# Patient Record
Sex: Male | Born: 1945 | Race: White | Hispanic: No | Marital: Married | Smoking: Never smoker
Health system: Southern US, Community
[De-identification: ages and names within clinical notes are randomized; demographics above are authoritative.]

## PROBLEM LIST (undated history)

## (undated) DIAGNOSIS — C801 Malignant (primary) neoplasm, unspecified: Secondary | ICD-10-CM

## (undated) DIAGNOSIS — I10 Essential (primary) hypertension: Secondary | ICD-10-CM

## (undated) DIAGNOSIS — E039 Hypothyroidism, unspecified: Secondary | ICD-10-CM

## (undated) DIAGNOSIS — I447 Left bundle-branch block, unspecified: Secondary | ICD-10-CM

## (undated) DIAGNOSIS — I2510 Atherosclerotic heart disease of native coronary artery without angina pectoris: Secondary | ICD-10-CM

## (undated) DIAGNOSIS — M1990 Unspecified osteoarthritis, unspecified site: Secondary | ICD-10-CM

## (undated) DIAGNOSIS — M5136 Other intervertebral disc degeneration, lumbar region: Secondary | ICD-10-CM

## (undated) DIAGNOSIS — N529 Male erectile dysfunction, unspecified: Secondary | ICD-10-CM

## (undated) DIAGNOSIS — E785 Hyperlipidemia, unspecified: Secondary | ICD-10-CM

## (undated) DIAGNOSIS — M51369 Other intervertebral disc degeneration, lumbar region without mention of lumbar back pain or lower extremity pain: Secondary | ICD-10-CM

## (undated) DIAGNOSIS — R55 Syncope and collapse: Secondary | ICD-10-CM

## (undated) HISTORY — DX: Male erectile dysfunction, unspecified: N52.9

## (undated) HISTORY — DX: Essential (primary) hypertension: I10

## (undated) HISTORY — DX: Syncope and collapse: R55

## (undated) HISTORY — DX: Hypothyroidism, unspecified: E03.9

## (undated) HISTORY — PX: CARDIAC CATHETERIZATION: SHX172

## (undated) HISTORY — DX: Left bundle-branch block, unspecified: I44.7

## (undated) HISTORY — DX: Atherosclerotic heart disease of native coronary artery without angina pectoris: I25.10

## (undated) HISTORY — PX: EYE SURGERY: SHX253

## (undated) HISTORY — PX: INCISIONAL HERNIA REPAIR: SHX193

## (undated) HISTORY — DX: Hyperlipidemia, unspecified: E78.5

## (undated) HISTORY — PX: TONSILLECTOMY: SUR1361

---

## 1998-11-22 HISTORY — PX: HERNIA REPAIR: SHX51

## 2003-11-23 DIAGNOSIS — C801 Malignant (primary) neoplasm, unspecified: Secondary | ICD-10-CM

## 2003-11-23 HISTORY — PX: OTHER SURGICAL HISTORY: SHX169

## 2003-11-23 HISTORY — DX: Malignant (primary) neoplasm, unspecified: C80.1

## 2004-03-13 ENCOUNTER — Other Ambulatory Visit: Admission: RE | Admit: 2004-03-13 | Discharge: 2004-03-13 | Payer: Self-pay | Admitting: Otolaryngology

## 2004-03-24 ENCOUNTER — Encounter: Admission: RE | Admit: 2004-03-24 | Discharge: 2004-03-24 | Payer: Self-pay | Admitting: Otolaryngology

## 2004-04-01 ENCOUNTER — Inpatient Hospital Stay (HOSPITAL_COMMUNITY): Admission: RE | Admit: 2004-04-01 | Discharge: 2004-04-02 | Payer: Self-pay | Admitting: Otolaryngology

## 2004-04-01 ENCOUNTER — Encounter (INDEPENDENT_AMBULATORY_CARE_PROVIDER_SITE_OTHER): Payer: Self-pay | Admitting: *Deleted

## 2004-04-08 ENCOUNTER — Ambulatory Visit: Admission: RE | Admit: 2004-04-08 | Discharge: 2004-07-07 | Payer: Self-pay | Admitting: Radiation Oncology

## 2004-04-14 ENCOUNTER — Encounter: Admission: AD | Admit: 2004-04-14 | Discharge: 2004-04-14 | Payer: Self-pay | Admitting: Dentistry

## 2004-05-17 ENCOUNTER — Inpatient Hospital Stay (HOSPITAL_COMMUNITY): Admission: AD | Admit: 2004-05-17 | Discharge: 2004-05-21 | Payer: Self-pay | Admitting: Oncology

## 2004-06-03 ENCOUNTER — Inpatient Hospital Stay (HOSPITAL_COMMUNITY): Admission: AD | Admit: 2004-06-03 | Discharge: 2004-06-09 | Payer: Self-pay | Admitting: Hematology & Oncology

## 2004-07-22 ENCOUNTER — Ambulatory Visit: Admission: RE | Admit: 2004-07-22 | Discharge: 2004-07-22 | Payer: Self-pay | Admitting: Radiation Oncology

## 2004-07-28 ENCOUNTER — Ambulatory Visit: Payer: Self-pay | Admitting: Dentistry

## 2004-08-06 ENCOUNTER — Ambulatory Visit (HOSPITAL_COMMUNITY): Admission: RE | Admit: 2004-08-06 | Discharge: 2004-08-06 | Payer: Self-pay | Admitting: Radiation Oncology

## 2004-09-17 ENCOUNTER — Ambulatory Visit: Admission: RE | Admit: 2004-09-17 | Discharge: 2004-09-17 | Payer: Self-pay | Admitting: Radiation Oncology

## 2004-10-29 ENCOUNTER — Ambulatory Visit (HOSPITAL_COMMUNITY): Admission: RE | Admit: 2004-10-29 | Discharge: 2004-10-29 | Payer: Self-pay | Admitting: Hematology & Oncology

## 2004-11-05 ENCOUNTER — Ambulatory Visit: Payer: Self-pay | Admitting: Hematology & Oncology

## 2004-12-17 ENCOUNTER — Ambulatory Visit: Admission: RE | Admit: 2004-12-17 | Discharge: 2004-12-17 | Payer: Self-pay | Admitting: Radiation Oncology

## 2005-01-20 ENCOUNTER — Ambulatory Visit (HOSPITAL_COMMUNITY): Admission: RE | Admit: 2005-01-20 | Discharge: 2005-01-20 | Payer: Self-pay | Admitting: Hematology & Oncology

## 2005-01-28 ENCOUNTER — Ambulatory Visit: Payer: Self-pay | Admitting: Hematology & Oncology

## 2005-03-17 ENCOUNTER — Ambulatory Visit: Admission: RE | Admit: 2005-03-17 | Discharge: 2005-03-17 | Payer: Self-pay | Admitting: Radiation Oncology

## 2005-04-22 ENCOUNTER — Ambulatory Visit (HOSPITAL_COMMUNITY): Admission: RE | Admit: 2005-04-22 | Discharge: 2005-04-22 | Payer: Self-pay | Admitting: Hematology & Oncology

## 2005-05-05 ENCOUNTER — Ambulatory Visit: Payer: Self-pay | Admitting: Hematology & Oncology

## 2005-06-16 ENCOUNTER — Ambulatory Visit: Admission: RE | Admit: 2005-06-16 | Discharge: 2005-06-16 | Payer: Self-pay | Admitting: Radiation Oncology

## 2005-06-23 ENCOUNTER — Encounter (INDEPENDENT_AMBULATORY_CARE_PROVIDER_SITE_OTHER): Payer: Self-pay | Admitting: *Deleted

## 2005-06-23 ENCOUNTER — Ambulatory Visit (HOSPITAL_COMMUNITY): Admission: RE | Admit: 2005-06-23 | Discharge: 2005-06-23 | Payer: Self-pay | Admitting: Orthopedic Surgery

## 2005-06-23 ENCOUNTER — Ambulatory Visit (HOSPITAL_BASED_OUTPATIENT_CLINIC_OR_DEPARTMENT_OTHER): Admission: RE | Admit: 2005-06-23 | Discharge: 2005-06-23 | Payer: Self-pay | Admitting: Orthopedic Surgery

## 2005-09-09 ENCOUNTER — Ambulatory Visit: Payer: Self-pay | Admitting: Hematology & Oncology

## 2005-09-13 ENCOUNTER — Ambulatory Visit (HOSPITAL_COMMUNITY): Admission: RE | Admit: 2005-09-13 | Discharge: 2005-09-13 | Payer: Self-pay | Admitting: Hematology & Oncology

## 2005-11-11 ENCOUNTER — Ambulatory Visit: Payer: Self-pay | Admitting: Gastroenterology

## 2005-11-29 ENCOUNTER — Ambulatory Visit: Payer: Self-pay | Admitting: Gastroenterology

## 2006-01-10 ENCOUNTER — Ambulatory Visit: Payer: Self-pay | Admitting: Hematology & Oncology

## 2006-01-12 ENCOUNTER — Ambulatory Visit (HOSPITAL_COMMUNITY): Admission: RE | Admit: 2006-01-12 | Discharge: 2006-01-12 | Payer: Self-pay | Admitting: Hematology & Oncology

## 2006-05-30 ENCOUNTER — Ambulatory Visit (HOSPITAL_COMMUNITY): Admission: RE | Admit: 2006-05-30 | Discharge: 2006-05-30 | Payer: Self-pay | Admitting: Hematology & Oncology

## 2006-05-30 ENCOUNTER — Ambulatory Visit: Payer: Self-pay | Admitting: Hematology & Oncology

## 2006-06-03 LAB — COMPREHENSIVE METABOLIC PANEL
BUN: 19 mg/dL (ref 6–23)
CO2: 21 mEq/L (ref 19–32)
Calcium: 9.1 mg/dL (ref 8.4–10.5)
Chloride: 104 mEq/L (ref 96–112)
Creatinine, Ser: 1.06 mg/dL (ref 0.40–1.50)
Total Bilirubin: 0.4 mg/dL (ref 0.3–1.2)

## 2006-06-03 LAB — CBC WITH DIFFERENTIAL/PLATELET
Basophils Absolute: 0 10*3/uL (ref 0.0–0.1)
HCT: 40.6 % (ref 38.7–49.9)
HGB: 13.9 g/dL (ref 13.0–17.1)
LYMPH%: 12.2 % — ABNORMAL LOW (ref 14.0–48.0)
MCH: 31.1 pg (ref 28.0–33.4)
MONO#: 0.6 10*3/uL (ref 0.1–0.9)
NEUT%: 69.1 % (ref 40.0–75.0)
Platelets: 219 10*3/uL (ref 145–400)
WBC: 5.3 10*3/uL (ref 4.0–10.0)
lymph#: 0.6 10*3/uL — ABNORMAL LOW (ref 0.9–3.3)

## 2006-12-02 ENCOUNTER — Ambulatory Visit (HOSPITAL_COMMUNITY): Admission: RE | Admit: 2006-12-02 | Discharge: 2006-12-02 | Payer: Self-pay | Admitting: Hematology & Oncology

## 2006-12-13 ENCOUNTER — Ambulatory Visit: Payer: Self-pay | Admitting: Hematology & Oncology

## 2006-12-16 LAB — COMPREHENSIVE METABOLIC PANEL
ALT: 11 U/L (ref 0–53)
AST: 12 U/L (ref 0–37)
Albumin: 4.3 g/dL (ref 3.5–5.2)
Calcium: 9.4 mg/dL (ref 8.4–10.5)
Chloride: 105 mEq/L (ref 96–112)
Creatinine, Ser: 1.01 mg/dL (ref 0.40–1.50)
Potassium: 4.2 mEq/L (ref 3.5–5.3)

## 2006-12-16 LAB — CBC WITH DIFFERENTIAL/PLATELET
BASO%: 0.9 % (ref 0.0–2.0)
EOS%: 6.8 % (ref 0.0–7.0)
MCH: 31 pg (ref 28.0–33.4)
MCHC: 34.1 g/dL (ref 32.0–35.9)
RDW: 13.1 % (ref 11.2–14.6)
lymph#: 0.9 10*3/uL (ref 0.9–3.3)

## 2007-07-13 ENCOUNTER — Ambulatory Visit (HOSPITAL_COMMUNITY): Admission: RE | Admit: 2007-07-13 | Discharge: 2007-07-13 | Payer: Self-pay | Admitting: Hematology & Oncology

## 2007-07-21 ENCOUNTER — Ambulatory Visit: Payer: Self-pay | Admitting: Hematology & Oncology

## 2007-09-21 ENCOUNTER — Ambulatory Visit (HOSPITAL_COMMUNITY): Admission: RE | Admit: 2007-09-21 | Discharge: 2007-09-21 | Payer: Self-pay | Admitting: Surgery

## 2007-11-23 HISTORY — PX: FRACTURE SURGERY: SHX138

## 2008-06-18 ENCOUNTER — Ambulatory Visit: Payer: Self-pay | Admitting: Hematology & Oncology

## 2008-08-08 ENCOUNTER — Ambulatory Visit: Payer: Self-pay | Admitting: Hematology & Oncology

## 2008-08-08 ENCOUNTER — Ambulatory Visit (HOSPITAL_COMMUNITY): Admission: RE | Admit: 2008-08-08 | Discharge: 2008-08-08 | Payer: Self-pay | Admitting: Hematology & Oncology

## 2008-08-09 LAB — CBC WITH DIFFERENTIAL (CANCER CENTER ONLY)
BASO#: 0 10*3/uL (ref 0.0–0.2)
EOS%: 3.4 % (ref 0.0–7.0)
HGB: 14.6 g/dL (ref 13.0–17.1)
LYMPH%: 24.2 % (ref 14.0–48.0)
MCH: 30.2 pg (ref 28.0–33.4)
MCHC: 34 g/dL (ref 32.0–35.9)
MCV: 89 fL (ref 82–98)
MONO%: 11.1 % (ref 0.0–13.0)
NEUT#: 2.9 10*3/uL (ref 1.5–6.5)

## 2008-08-09 LAB — TSH: TSH: 8.367 u[IU]/mL — ABNORMAL HIGH (ref 0.350–4.500)

## 2008-08-09 LAB — COMPREHENSIVE METABOLIC PANEL
ALT: 15 U/L (ref 0–53)
Alkaline Phosphatase: 52 U/L (ref 39–117)
CO2: 22 mEq/L (ref 19–32)
Creatinine, Ser: 1.11 mg/dL (ref 0.40–1.50)
Sodium: 139 mEq/L (ref 135–145)
Total Bilirubin: 0.5 mg/dL (ref 0.3–1.2)
Total Protein: 6.8 g/dL (ref 6.0–8.3)

## 2008-08-09 LAB — TECHNOLOGIST REVIEW CHCC SATELLITE

## 2009-06-04 ENCOUNTER — Inpatient Hospital Stay (HOSPITAL_COMMUNITY): Admission: EM | Admit: 2009-06-04 | Discharge: 2009-06-12 | Payer: Self-pay | Admitting: Emergency Medicine

## 2009-06-05 ENCOUNTER — Encounter: Payer: Self-pay | Admitting: Cardiothoracic Surgery

## 2009-06-05 ENCOUNTER — Ambulatory Visit: Payer: Self-pay | Admitting: Cardiothoracic Surgery

## 2009-06-06 HISTORY — PX: CORONARY ARTERY BYPASS GRAFT: SHX141

## 2009-06-09 ENCOUNTER — Encounter: Payer: Self-pay | Admitting: Cardiothoracic Surgery

## 2009-06-13 ENCOUNTER — Inpatient Hospital Stay (HOSPITAL_COMMUNITY): Admission: EM | Admit: 2009-06-13 | Discharge: 2009-06-15 | Payer: Self-pay | Admitting: Emergency Medicine

## 2009-06-13 ENCOUNTER — Ambulatory Visit: Payer: Self-pay | Admitting: Internal Medicine

## 2009-06-15 ENCOUNTER — Inpatient Hospital Stay (HOSPITAL_COMMUNITY): Admission: EM | Admit: 2009-06-15 | Discharge: 2009-06-18 | Payer: Self-pay | Admitting: Emergency Medicine

## 2009-06-15 ENCOUNTER — Ambulatory Visit: Payer: Self-pay | Admitting: Internal Medicine

## 2009-06-16 ENCOUNTER — Encounter: Payer: Self-pay | Admitting: Cardiology

## 2009-06-25 ENCOUNTER — Encounter: Payer: Self-pay | Admitting: Internal Medicine

## 2009-06-26 ENCOUNTER — Ambulatory Visit: Payer: Self-pay | Admitting: Cardiothoracic Surgery

## 2009-06-26 ENCOUNTER — Encounter: Admission: RE | Admit: 2009-06-26 | Discharge: 2009-06-26 | Payer: Self-pay | Admitting: Cardiothoracic Surgery

## 2009-07-24 ENCOUNTER — Encounter (HOSPITAL_COMMUNITY): Admission: RE | Admit: 2009-07-24 | Discharge: 2009-10-22 | Payer: Self-pay | Admitting: Cardiology

## 2009-08-15 ENCOUNTER — Ambulatory Visit: Payer: Self-pay | Admitting: Cardiothoracic Surgery

## 2009-09-03 ENCOUNTER — Ambulatory Visit: Payer: Self-pay | Admitting: Hematology & Oncology

## 2009-10-23 ENCOUNTER — Encounter (HOSPITAL_COMMUNITY): Admission: RE | Admit: 2009-10-23 | Discharge: 2009-11-18 | Payer: Self-pay | Admitting: Cardiology

## 2010-07-07 ENCOUNTER — Telehealth (INDEPENDENT_AMBULATORY_CARE_PROVIDER_SITE_OTHER): Payer: Self-pay | Admitting: *Deleted

## 2010-07-08 ENCOUNTER — Encounter: Payer: Self-pay | Admitting: Cardiovascular Disease

## 2010-07-08 ENCOUNTER — Ambulatory Visit: Payer: Self-pay

## 2010-07-08 ENCOUNTER — Ambulatory Visit: Payer: Self-pay | Admitting: Cardiovascular Disease

## 2010-07-08 ENCOUNTER — Encounter (HOSPITAL_COMMUNITY): Admission: RE | Admit: 2010-07-08 | Discharge: 2010-08-13 | Payer: Self-pay | Admitting: Cardiology

## 2010-08-27 IMAGING — CR DG CHEST 1V PORT
1 series · 1 of 1 positions shown · non-contrast
Comparison: 06/06/2009 and earlier.

CLINICAL DATA: 62-year-old male status post CABG.

PORTABLE CHEST - 1 VIEW

[view not recorded]
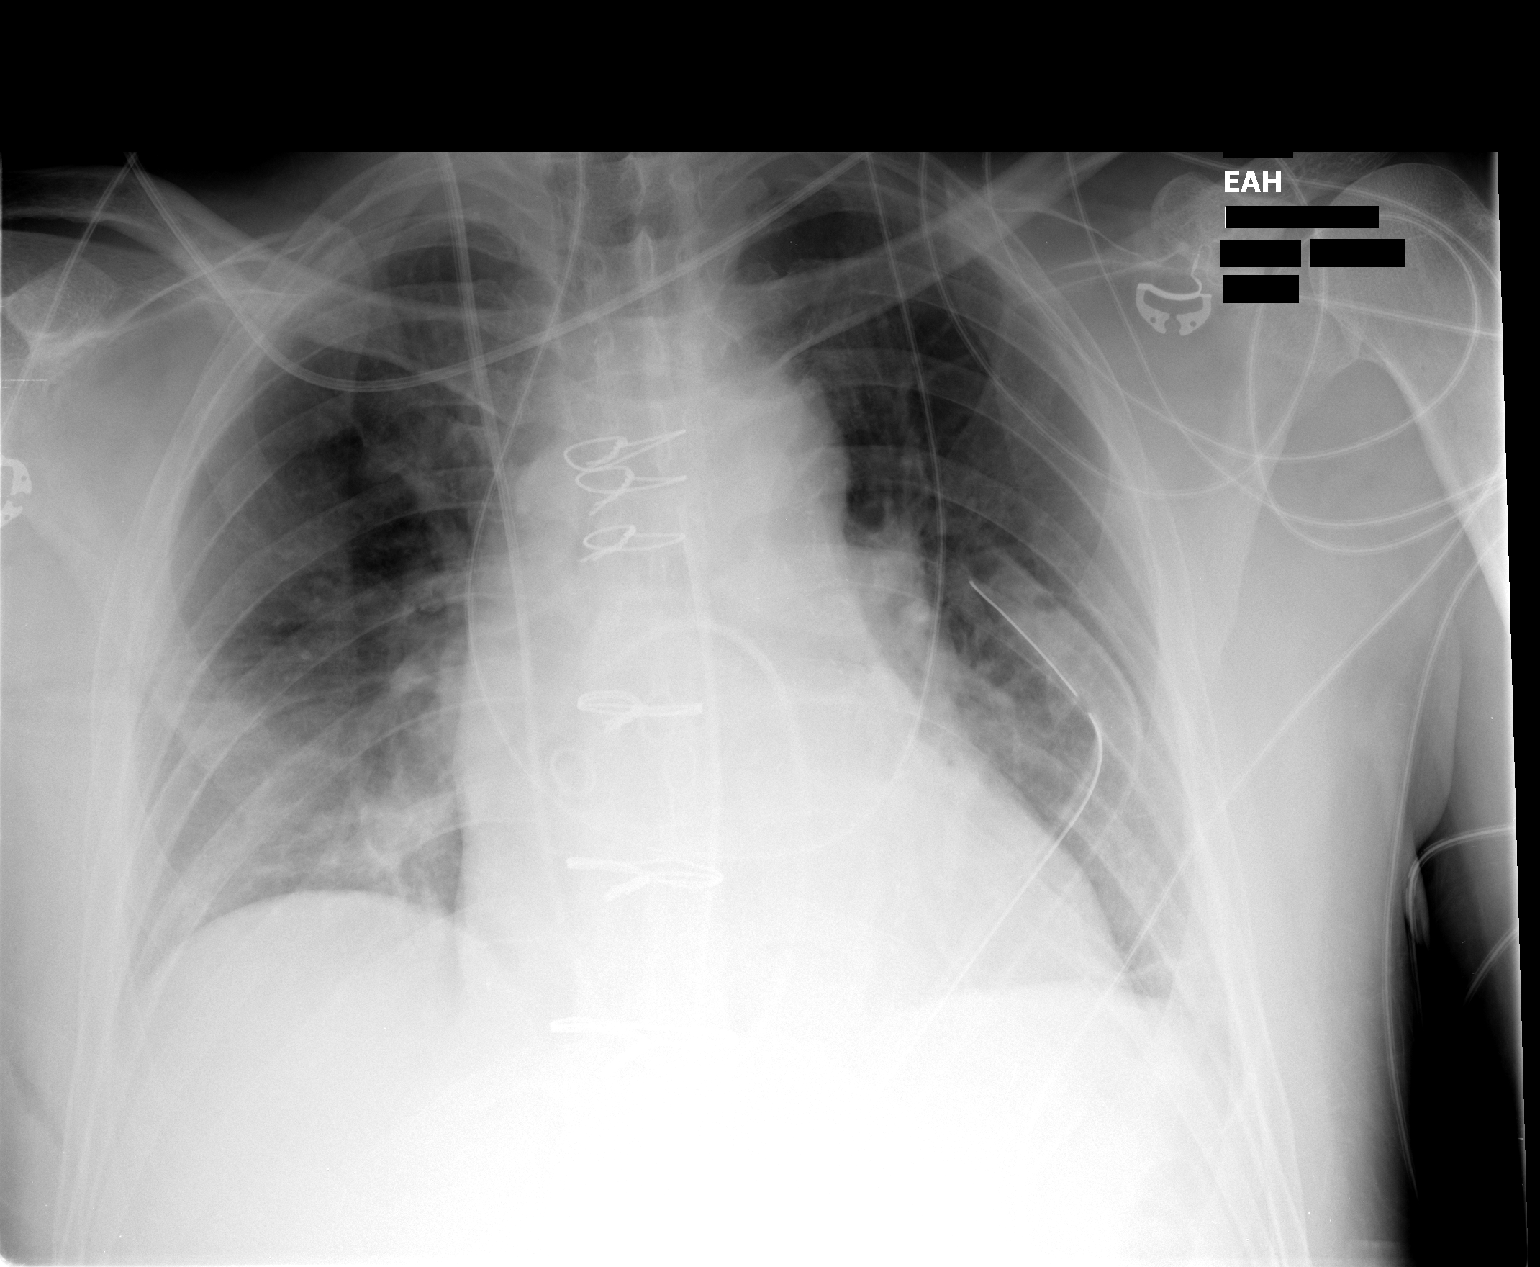

[1 of 1 positions shown; findings below may reference images not displayed]

FINDINGS: Portable semi upright AP view 2622 hours.  Stable right
IJ approach Swan-Ganz catheter, tip the level of the right main
pulmonary artery.  The patient is extubated.  Enteric tube is
removed.  Stable left thoracostomy tube and mediastinal drain.
Stable sequelae of CABG.  Stable cardiac size and mediastinal
contours.  Interval decreased atelectasis.  No pulmonary edema or
pneumothorax identified.
IMPRESSION: 1.  Extubated.  Enteric tube removed.
2.  Decreasing atelectasis.  No pneumothorax identified.

## 2010-08-29 IMAGING — CR DG CHEST 2V
2 series · 2 of 2 positions shown · non-contrast
Comparison: 06/08/2009

CLINICAL DATA: Unstable angina/post CABG

CHEST - 2 VIEW

[w chest pa]
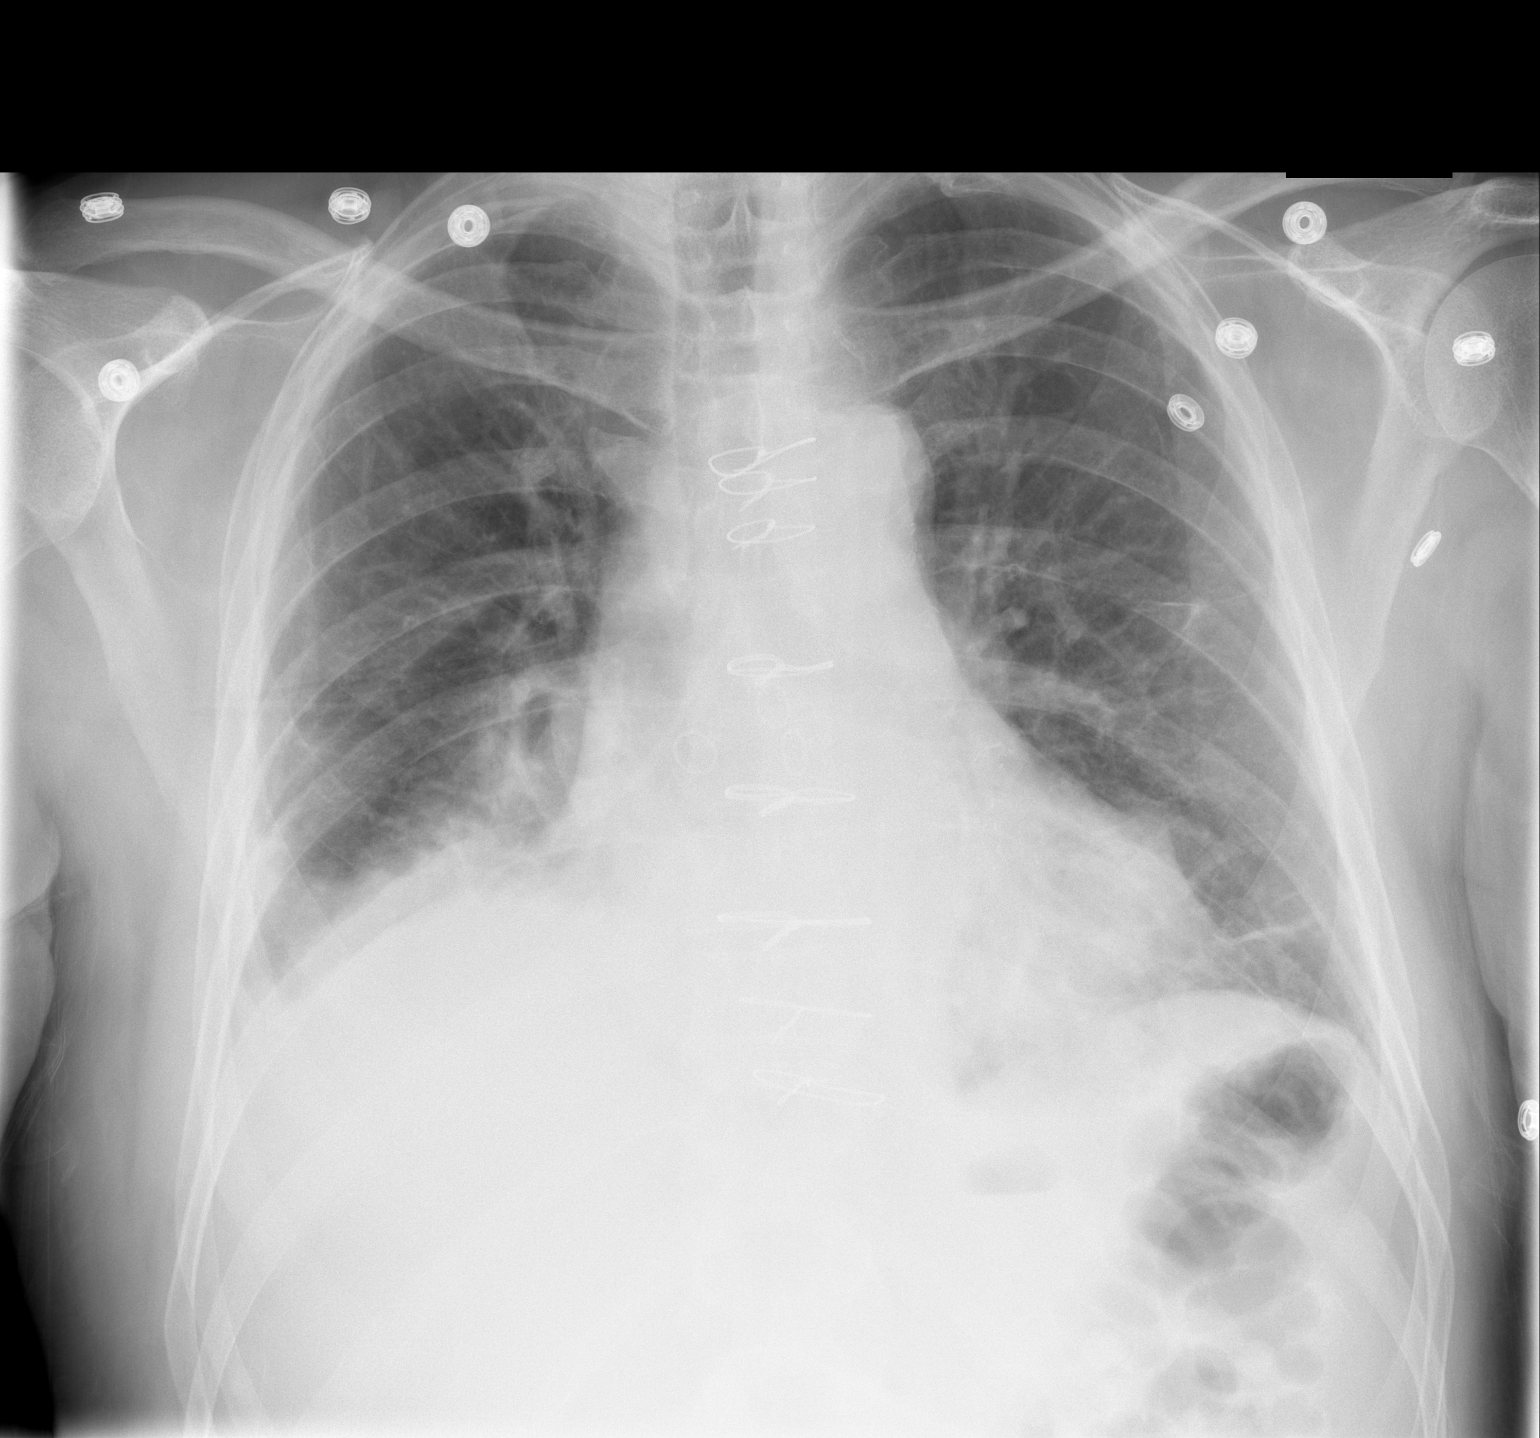

[w chest lat]
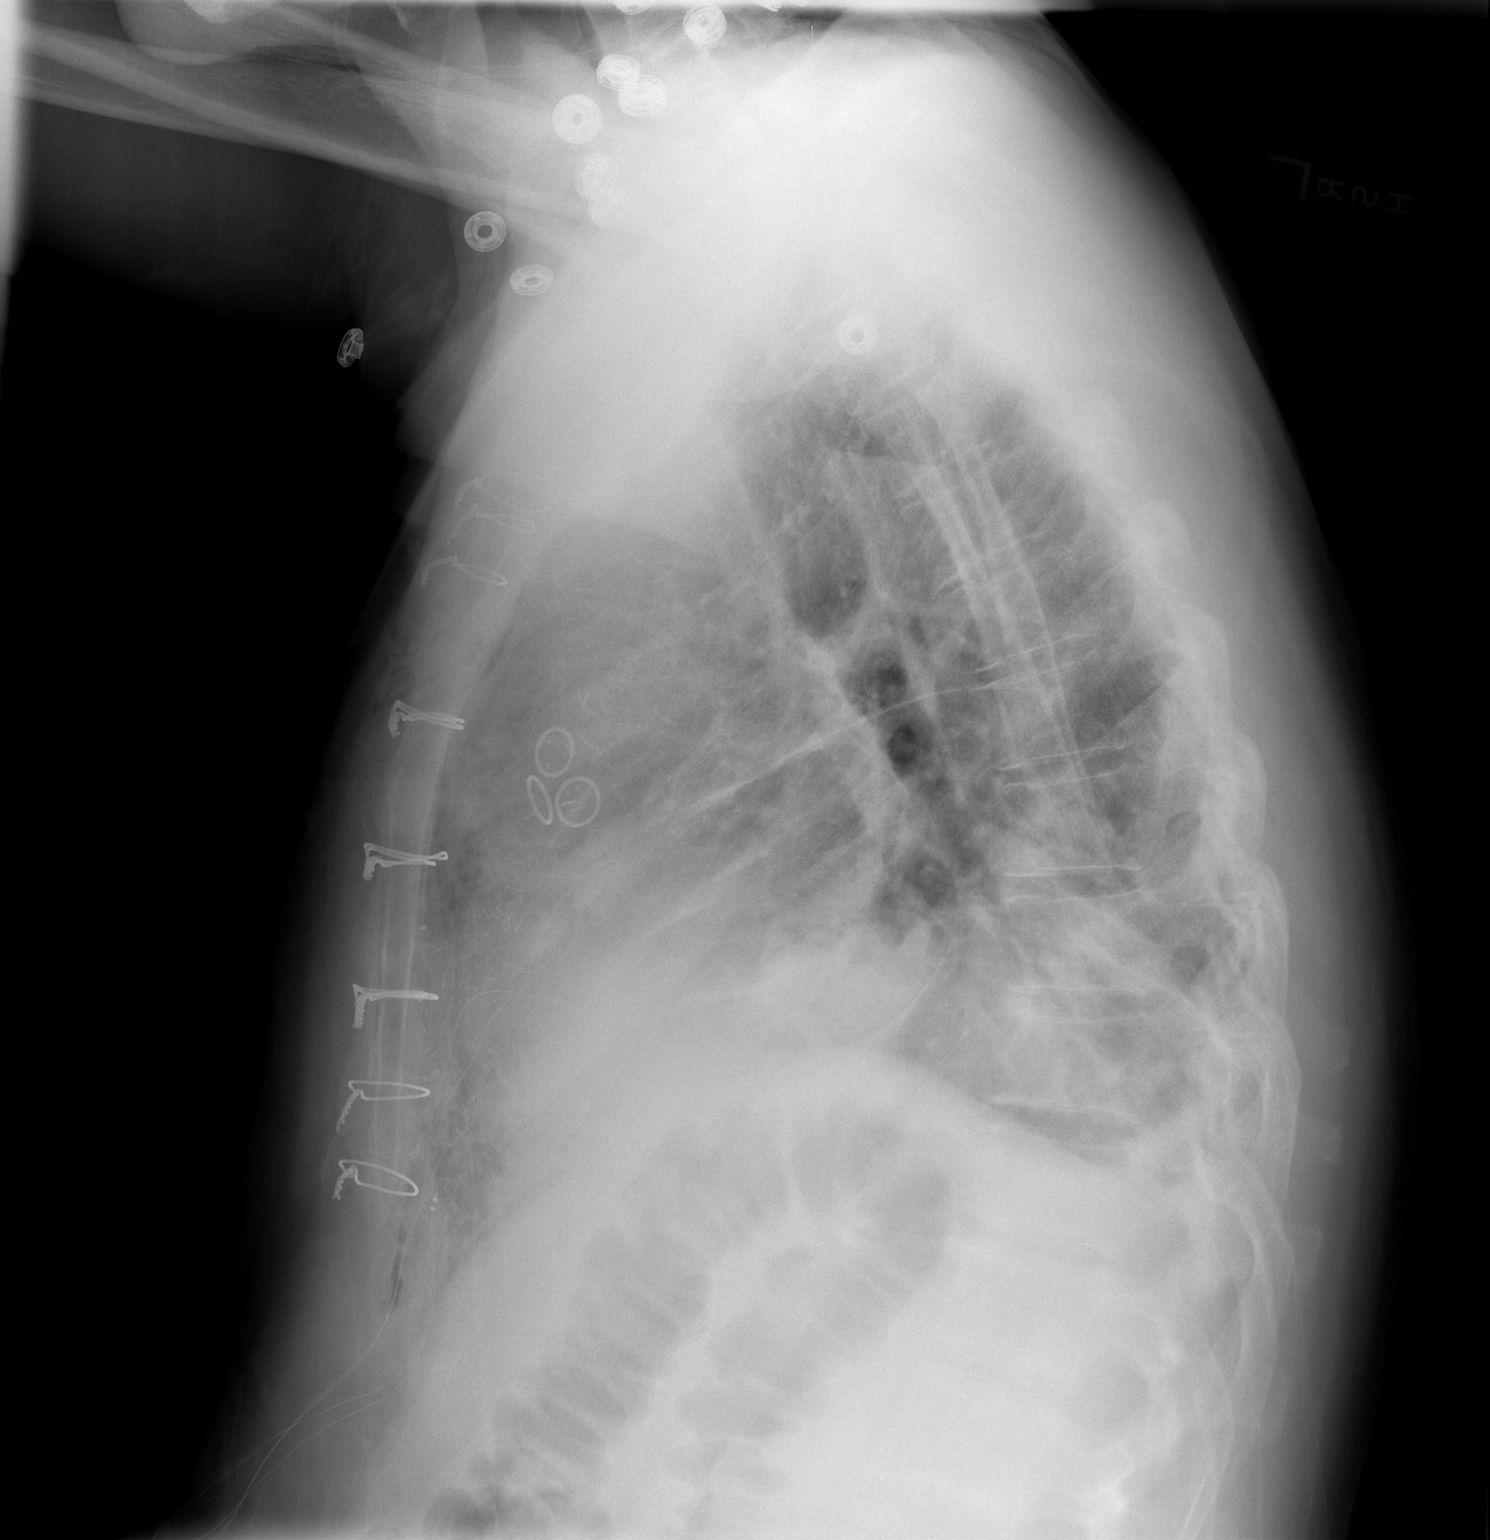

[2 of 2 positions shown; findings below may reference images not displayed]

FINDINGS: No definite residual left apical pneumothorax.  There
still appears to be a 5% or less right apical pneumothorax.

All chest tube has now been removed.  Heart size within normal
limits without congestive heart failure.  Mild bibasilar
atelectasis about the same.
IMPRESSION: 1.  Small right apical pneumothorax still noted.
2.  No definite left apical pneumothorax.
3.  Mild bibasilar atelectasis.
4.  No new findings.

## 2010-09-02 IMAGING — CR DG CHEST 1V PORT
1 series · 1 of 1 positions shown · non-contrast
Comparison: 06/09/2009

CLINICAL DATA: Syncope

PORTABLE CHEST - 1 VIEW

[AP]
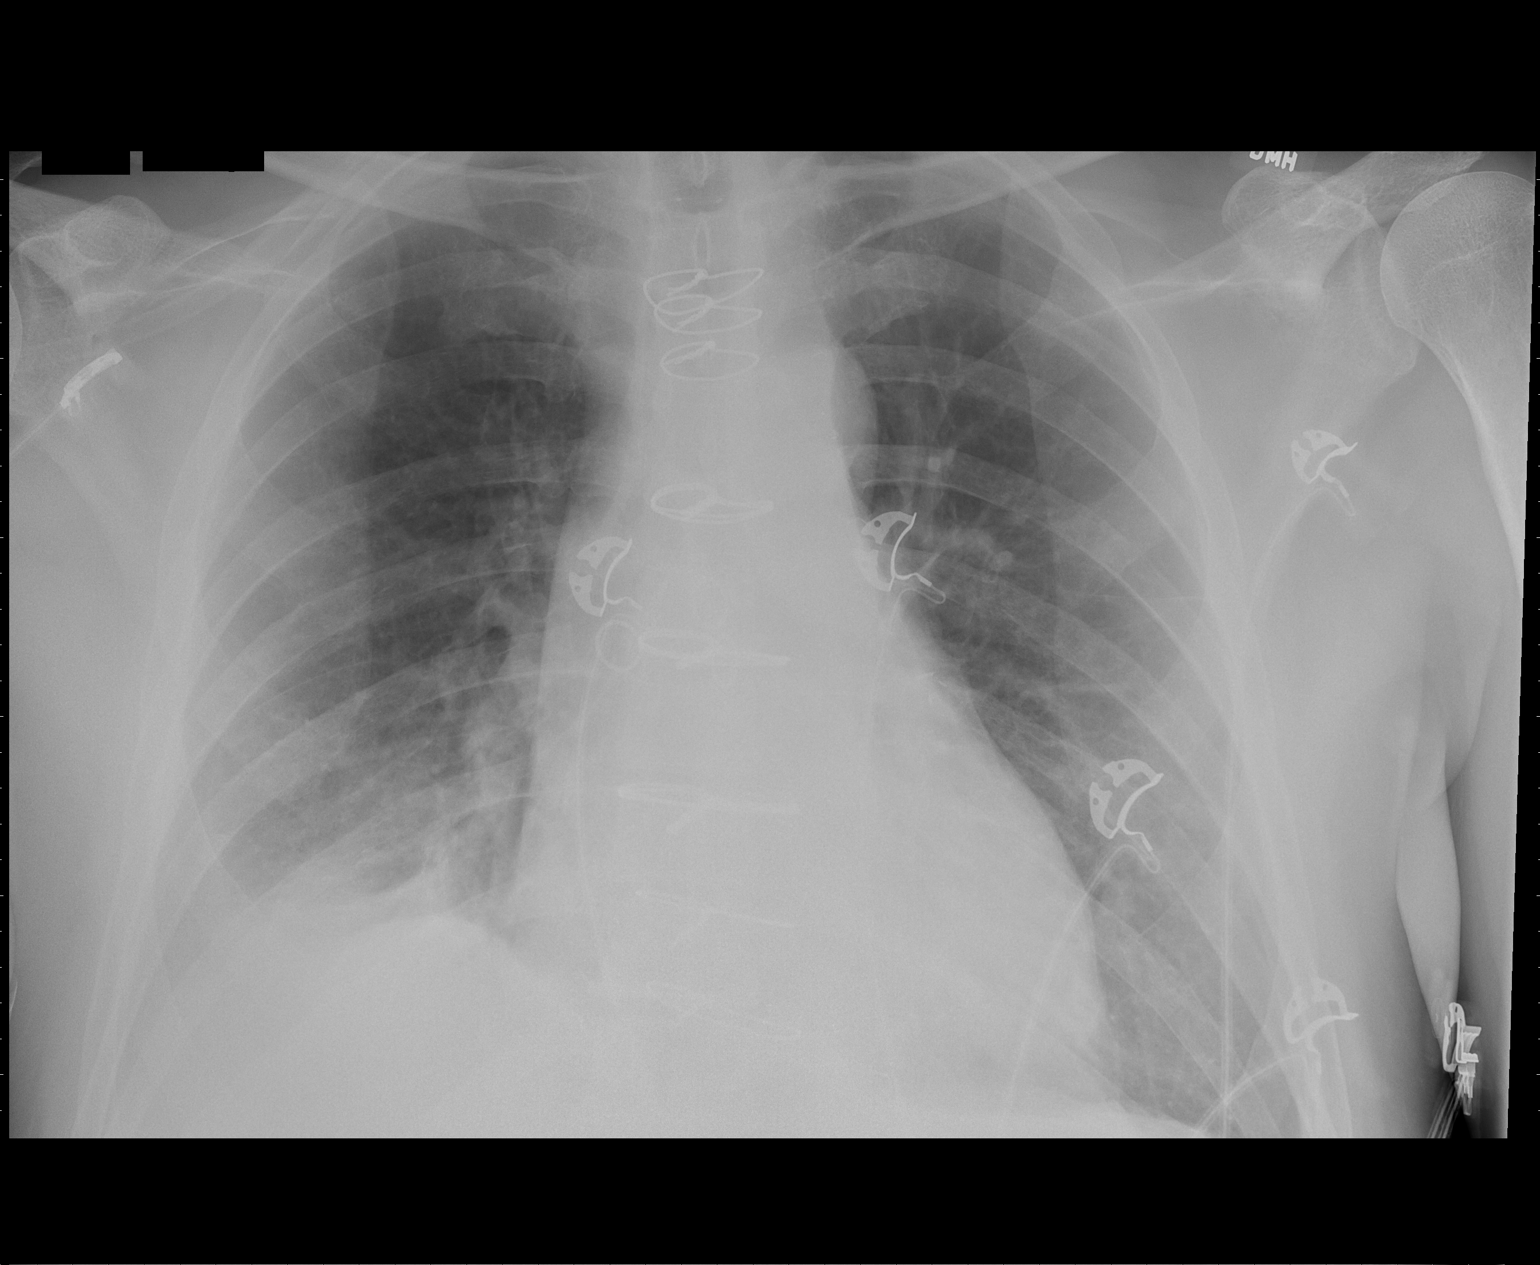

[1 of 1 positions shown; findings below may reference images not displayed]

FINDINGS: Cardiomediastinal silhouette is stable.  Status post
median sternotomy again noted.  There is small right pleural
effusion with right basilar atelectasis or infiltrate.  No
pulmonary edema.
IMPRESSION: Small right pleural effusion with right basilar atelectasis or
infiltrate.  Status post median sternotomy again noted.

## 2010-10-09 ENCOUNTER — Ambulatory Visit: Payer: Self-pay | Admitting: Cardiology

## 2010-10-09 LAB — LIPID PANEL
HDL: 51 mg/dL (ref 35–70)
LDL Cholesterol: 118 mg/dL

## 2010-10-09 LAB — TSH: TSH: 4.49 u[IU]/mL (ref <=5.90)

## 2010-12-13 ENCOUNTER — Encounter: Payer: Self-pay | Admitting: Hematology & Oncology

## 2010-12-21 ENCOUNTER — Encounter: Payer: Self-pay | Admitting: Cardiology

## 2010-12-21 DIAGNOSIS — R55 Syncope and collapse: Secondary | ICD-10-CM | POA: Insufficient documentation

## 2010-12-21 DIAGNOSIS — I447 Left bundle-branch block, unspecified: Secondary | ICD-10-CM | POA: Insufficient documentation

## 2010-12-21 DIAGNOSIS — I2510 Atherosclerotic heart disease of native coronary artery without angina pectoris: Secondary | ICD-10-CM | POA: Insufficient documentation

## 2010-12-22 NOTE — Assessment & Plan Note (Signed)
Summary: Cardiology Nuclear Testing  Nuclear Med Background Indications for Stress Test: Evaluation for Ischemia, Graft Patency   History: CABG, Echo, Heart Catheterization  History Comments: 7/10 CABG x 5, EF=45%; 7/10 Echo:EF=40-45%; post op afib and LBBB   Symptoms Comments: No cardiac complaints   Nuclear Pre-Procedure Cardiac Risk Factors: Family History - CAD Caffeine/Decaff Intake: None NPO After: 6:30 PM Lungs: Clear.  O2 Sat 97% on RA. IV 0.9% NS with Angio Cath: 22g     IV Site: Rt Hand IV Started by: Bonnita Levan RN Chest Size (in) 42     Height (in): 75 Weight (lb): 224 BMI: 28.10 Tech Comments: Metoprolol held x 24 hours.  Nuclear Med Study 1 or 2 day study:  1 day     Stress Test Type:  Adenosine Reading MD:  Charlton Haws, MD     Referring MD:  Cassell Clement, MD Resting Radionuclide:  Technetium 59m Tetrofosmin     Resting Radionuclide Dose:  10.9 mCi  Stress Radionuclide:  Technetium 59m Tetrofosmin     Stress Radionuclide Dose:  33.0 mCi   Stress Protocol  Dose of Adenosine:  57.0 mg    Stress Test Technologist:  Rea College CMA-N     Nuclear Technologist:  Domenic Polite CNMT  Rest Procedure  Myocardial perfusion imaging was performed at rest 45 minutes following the intravenous administration of Myoview Technetium 8m Tetrofosmin.  Stress Procedure  The patient received IV adenosine at 140 mcg/kg/min for 4 minutes. There were episodes of 2nd degree AVB with infusion.  He c/o chest pressure with infusion.  Myoview was injected at the 2 minute mark and quantitative spect images were obtained after a 45 minute delay.  QPS Raw Data Images:  Normal; no motion artifact; normal heart/lung ratio. Stress Images:  NI: Uniform and normal uptake of tracer in all myocardial segments. Rest Images:  Normal homogeneous uptake in all areas of the myocardium. Subtraction (SDS):  0 Transient Ischemic Dilatation:  1.05  (Normal <1.22)  Lung/Heart Ratio:  .34   (Normal <0.45)  Quantitative Gated Spect Images QGS EDV:  169 ml QGS ESV:  80 ml QGS EF:  53 % QGS cine images:  Inferobasal hypokinesis  Findings Low risk nuclear study  Evidence for inferior infarct     Overall Impression  Exercise Capacity: Lexiscan BP Response: Normal blood pressure response. 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  LBBB

## 2010-12-22 NOTE — Progress Notes (Signed)
Summary: Nuclear pre procedure  Phone Note Outgoing Call Call back at [facility] Phone [phone]   Call placed by: Rea College, CMA,  July 07, 2010 4:52 PM Call placed to: Patient Summary of Call: Left message with information on Myoview Information Sheet (see scanned document for details).      Nuclear Med Background Indications for Stress Test: Evaluation for Ischemia, Graft Patency   History: CABG, Echo, Heart Catheterization  History Comments: 7/10 CABG x 5, EF=45%; 7/10 Echo:EF=40-45%; post op afib and LBBB

## 2011-02-09 ENCOUNTER — Ambulatory Visit: Payer: Self-pay | Admitting: Cardiology

## 2011-02-24 ENCOUNTER — Ambulatory Visit (INDEPENDENT_AMBULATORY_CARE_PROVIDER_SITE_OTHER): Payer: 59 | Admitting: Cardiology

## 2011-02-24 ENCOUNTER — Encounter: Payer: Self-pay | Admitting: Cardiology

## 2011-02-24 DIAGNOSIS — E7800 Pure hypercholesterolemia, unspecified: Secondary | ICD-10-CM

## 2011-02-24 DIAGNOSIS — I2510 Atherosclerotic heart disease of native coronary artery without angina pectoris: Secondary | ICD-10-CM

## 2011-02-24 DIAGNOSIS — R55 Syncope and collapse: Secondary | ICD-10-CM

## 2011-02-24 DIAGNOSIS — N529 Male erectile dysfunction, unspecified: Secondary | ICD-10-CM | POA: Insufficient documentation

## 2011-02-24 DIAGNOSIS — E039 Hypothyroidism, unspecified: Secondary | ICD-10-CM | POA: Insufficient documentation

## 2011-02-24 LAB — HEPATIC FUNCTION PANEL
AST: 22 U/L (ref 0–37)
Albumin: 3.6 g/dL (ref 3.5–5.2)
Bilirubin, Direct: 0.1 mg/dL (ref 0.0–0.3)
Total Protein: 6.1 g/dL (ref 6.0–8.3)

## 2011-02-24 LAB — LIPID PANEL
Cholesterol: 95 mg/dL (ref 0–200)
HDL: 40.6 mg/dL (ref >39.00)
LDL Cholesterol: 44 mg/dL (ref 0–99)
Total CHOL/HDL Ratio: 2
Triglycerides: 51 mg/dL (ref 0.0–149.0)
VLDL: 10.2 mg/dL (ref 0.0–40.0)

## 2011-02-24 LAB — BASIC METABOLIC PANEL
Calcium: 8.9 mg/dL (ref 8.4–10.5)
Sodium: 141 mEq/L (ref 135–145)

## 2011-02-24 NOTE — Assessment & Plan Note (Signed)
The patient has a past history of coronary bypass graft surgery x5 on 06/06/09.  He has been doing well.  He is not experiencing any chest pain or shortness of breath.  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

## 2011-02-24 NOTE — Progress Notes (Signed)
HPI: This pleasant 65 year old gentleman is seen for a followup office visit.  He has known coronary artery disease.  He underwent coronary artery bypass graft surgery on 06/06/09.  Postoperatively he was found to have a new left bundle-branch block which has persisted.  Shortly after discharge from the hospital after his bypass surgery he was readmitted twice for syncope and that led to a full EP workup which was negative.The patient has been feeling well previous had no cardiac symptoms.  He works out at [employer] several times a week and also stays busy refereeing soccer matches.  He had a normal LexiScan Cardiolite stress test on 07/09/10.  Current Outpatient Prescriptions  Medication Sig Dispense Refill  . aspirin 81 MG tablet Take 81 mg by mouth daily.        Marland Kitchen levothyroxine (SYNTHROID, LEVOTHROID) 175 MCG tablet Take 175 mcg by mouth daily.        . metoprolol (LOPRESSOR) 50 MG tablet Take 50 mg by mouth 2 (two) times daily.        . rosuvastatin (CRESTOR) 20 MG tablet Take 20 mg by mouth daily.        . sildenafil (VIAGRA) 100 MG tablet Take 100 mg by mouth as needed.        Marland Kitchen acetaminophen (TYLENOL) 325 MG tablet Take 650 mg by mouth as needed.        . nitroGLYCERIN (NITROSTAT) 0.4 MG SL tablet Place 0.4 mg under the tongue every 5 (five) minutes as needed.          Not on File  Patient Active Problem List  Diagnoses  . CAD (coronary artery disease)  . LBBB (left bundle branch block)  . Syncope  . Hypothyroidism  . Erectile dysfunction    History  Smoking status  . Never Smoker   Smokeless tobacco  . Not on file    History  Alcohol Use: Not on file    Family History  Problem Relation Age of Onset  . Heart attack Father   . Ovarian cancer Mother     Review of Systems: The patient denies any heat or cold intolerance.  No weight gain or weight loss.  The patient denies headaches or blurry vision.  There is no cough or sputum production.  The patient denies dizziness.   There is no hematuria or hematochezia.  The patient denies any muscle aches or arthritis.  The patient denies any rash.  The patient denies frequent falling or instability.  There is no history of depression or anxiety.  All other systems were reviewed and are negative.   Physical Exam: Filed Vitals:   02/24/11 1111  BP: 106/70  Pulse: 64  Weight is 224, down 5 pounds.  The general appearance feels a well-developed well-nourished gentleman in no distress.Pupils equal and reactive.   Extraocular Movements are full.  There is no scleral icterus.  The mouth and pharynx are normal.  The neck is supple.  The carotids reveal no bruits.  The jugular venous pressure is normal.  The thyroid is not enlarged.  There is no lymphadenopathy.The chest is clear to percussion and auscultation. There are no rales or rhonchi. Expansion of the chest is symmetrical.The precordium is quiet.  The first heart sound is normal.  The second heart sound is physiologically split.  There is no murmur gallop rub or click.  There is no abnormal lift or heave.The abdomen is soft and nontender. Bowel sounds are normal. The liver and spleen are not  enlarged. There Are no abdominal masses. There are no bruits.The pedal pulses are good.  There is no phlebitis or edema.  There is no cyanosis or clubbing.Strength is normal and symmetrical in all extremities.  There is no lateralizing weakness.  There are no sensory deficits.The skin is warm and dry.  There is no rash.    Assessment / Plan: Continue present medication.  Recheck in 4 months for followup office visit and lab work.

## 2011-02-24 NOTE — Assessment & Plan Note (Signed)
The patient has not had any dizziness or syncope.  His left bundle-branch block remains asymptomatic.  He did have a normal EP study at [facility] in July 2010

## 2011-02-24 NOTE — Assessment & Plan Note (Signed)
He is not having any symptoms of hypothyroidism.  He remains on Synthroid 175 mcg daily.  We are checking thyroid function today.

## 2011-02-25 ENCOUNTER — Telehealth: Payer: Self-pay | Admitting: *Deleted

## 2011-02-25 ENCOUNTER — Other Ambulatory Visit: Payer: Self-pay | Admitting: *Deleted

## 2011-02-25 ENCOUNTER — Encounter: Payer: Self-pay | Admitting: *Deleted

## 2011-02-25 NOTE — Telephone Encounter (Signed)
Mailed lab letter

## 2011-02-25 NOTE — Telephone Encounter (Signed)
Message copied by Regis Bill on Thu Feb 25, 2011  4:18 PM ------      Message from: Cassell Clement      Created: Thu Feb 25, 2011  1:21 PM       Blood work Is satisfactory.  Lipids are good.  Thyroid dose is good.  Continue same medication.

## 2011-02-28 LAB — POCT I-STAT, CHEM 8
BUN: 8 mg/dL (ref 6–23)
Calcium, Ion: 1.13 mmol/L (ref 1.12–1.32)
Chloride: 106 mEq/L (ref 96–112)
Chloride: 99 mEq/L (ref 96–112)
Chloride: 104 mEq/L (ref 96–112)
Creatinine, Ser: 1 mg/dL (ref 0.4–1.5)
Glucose, Bld: 150 mg/dL — ABNORMAL HIGH (ref 70–99)
HCT: 30 % — ABNORMAL LOW (ref 39.0–52.0)
HCT: 40 % (ref 39.0–52.0)
Hemoglobin: 10.2 g/dL — ABNORMAL LOW (ref 13.0–17.0)
Hemoglobin: 13.6 g/dL (ref 13.0–17.0)
Potassium: 4.1 mEq/L (ref 3.5–5.1)
Potassium: 3.8 mEq/L (ref 3.5–5.1)
Sodium: 135 mEq/L (ref 135–145)
Sodium: 139 mEq/L (ref 135–145)
TCO2: 22 mmol/L (ref 0–100)

## 2011-02-28 LAB — CARDIAC PANEL(CRET KIN+CKTOT+MB+TROPI)
CK, MB: 1.3 ng/mL (ref 0.3–4.0)
CK, MB: 1.5 ng/mL (ref 0.3–4.0)
CK, MB: 1.7 ng/mL (ref 0.3–4.0)
CK, MB: 1.6 ng/mL (ref 0.3–4.0)
Relative Index: INVALID (ref 0.0–2.5)
Relative Index: INVALID (ref 0.0–2.5)
Relative Index: INVALID (ref 0.0–2.5)
Relative Index: INVALID (ref 0.0–2.5)
Total CK: 59 U/L (ref 7–232)
Total CK: 94 U/L (ref 7–232)
Troponin I: 0.03 ng/mL (ref 0.00–0.06)
Troponin I: 0.05 ng/mL (ref 0.00–0.06)
Troponin I: 0.08 ng/mL — ABNORMAL HIGH (ref 0.00–0.06)
Troponin I: 0.2 ng/mL — ABNORMAL HIGH (ref 0.00–0.06)
Troponin I: 0.23 ng/mL — ABNORMAL HIGH (ref 0.00–0.06)

## 2011-02-28 LAB — CBC
HCT: 28.1 % — ABNORMAL LOW (ref 39.0–52.0)
HCT: 28.5 % — ABNORMAL LOW (ref 39.0–52.0)
HCT: 29.4 % — ABNORMAL LOW (ref 39.0–52.0)
HCT: 29.5 % — ABNORMAL LOW (ref 39.0–52.0)
HCT: 30.3 % — ABNORMAL LOW (ref 39.0–52.0)
HCT: 33.2 % — ABNORMAL LOW (ref 39.0–52.0)
HCT: 37.5 % — ABNORMAL LOW (ref 39.0–52.0)
Hemoglobin: 10 g/dL — ABNORMAL LOW (ref 13.0–17.0)
Hemoglobin: 10.1 g/dL — ABNORMAL LOW (ref 13.0–17.0)
Hemoglobin: 10.2 g/dL — ABNORMAL LOW (ref 13.0–17.0)
Hemoglobin: 11.4 g/dL — ABNORMAL LOW (ref 13.0–17.0)
Hemoglobin: 12.8 g/dL — ABNORMAL LOW (ref 13.0–17.0)
Hemoglobin: 9.6 g/dL — ABNORMAL LOW (ref 13.0–17.0)
Hemoglobin: 9.6 g/dL — ABNORMAL LOW (ref 13.0–17.0)
MCHC: 32.8 g/dL (ref 30.0–36.0)
MCHC: 32.9 g/dL (ref 30.0–36.0)
MCHC: 33.8 g/dL (ref 30.0–36.0)
MCHC: 33.8 g/dL (ref 30.0–36.0)
MCHC: 34 g/dL (ref 30.0–36.0)
MCHC: 34.1 g/dL (ref 30.0–36.0)
MCHC: 34.1 g/dL (ref 30.0–36.0)
MCHC: 34.2 g/dL (ref 30.0–36.0)
MCHC: 34.2 g/dL (ref 30.0–36.0)
MCHC: 33.7 g/dL (ref 30.0–36.0)
MCV: 88 fL (ref 78.0–100.0)
MCV: 88.7 fL (ref 78.0–100.0)
MCV: 89 fL (ref 78.0–100.0)
MCV: 89.3 fL (ref 78.0–100.0)
MCV: 89.6 fL (ref 78.0–100.0)
MCV: 89.7 fL (ref 78.0–100.0)
MCV: 89.7 fL (ref 78.0–100.0)
MCV: 89.8 fL (ref 78.0–100.0)
Platelets: 132 10*3/uL — ABNORMAL LOW (ref 150–400)
Platelets: 150 10*3/uL (ref 150–400)
Platelets: 159 10*3/uL (ref 150–400)
Platelets: 164 10*3/uL (ref 150–400)
Platelets: 165 10*3/uL (ref 150–400)
Platelets: 186 10*3/uL (ref 150–400)
Platelets: 216 10*3/uL (ref 150–400)
Platelets: 342 10*3/uL (ref 150–400)
Platelets: 206 10*3/uL (ref 150–400)
Platelets: 234 10*3/uL (ref 150–400)
RBC: 3.15 MIL/uL — ABNORMAL LOW (ref 4.22–5.81)
RBC: 3.17 MIL/uL — ABNORMAL LOW (ref 4.22–5.81)
RBC: 3.29 MIL/uL — ABNORMAL LOW (ref 4.22–5.81)
RBC: 3.31 MIL/uL — ABNORMAL LOW (ref 4.22–5.81)
RBC: 3.38 MIL/uL — ABNORMAL LOW (ref 4.22–5.81)
RBC: 3.77 MIL/uL — ABNORMAL LOW (ref 4.22–5.81)
RBC: 3.88 MIL/uL — ABNORMAL LOW (ref 4.22–5.81)
RBC: 4.18 MIL/uL — ABNORMAL LOW (ref 4.22–5.81)
RBC: 4.4 MIL/uL (ref 4.22–5.81)
RDW: 13.7 % (ref 11.5–15.5)
RDW: 13.7 % (ref 11.5–15.5)
RDW: 13.8 % (ref 11.5–15.5)
RDW: 13.9 % (ref 11.5–15.5)
RDW: 14.1 % (ref 11.5–15.5)
RDW: 14.1 % (ref 11.5–15.5)
RDW: 14.2 % (ref 11.5–15.5)
RDW: 14.3 % (ref 11.5–15.5)
RDW: 13.7 % (ref 11.5–15.5)
WBC: 11 10*3/uL — ABNORMAL HIGH (ref 4.0–10.5)
WBC: 11.2 10*3/uL — ABNORMAL HIGH (ref 4.0–10.5)
WBC: 11.4 10*3/uL — ABNORMAL HIGH (ref 4.0–10.5)
WBC: 4.8 10*3/uL (ref 4.0–10.5)
WBC: 8.5 10*3/uL (ref 4.0–10.5)
WBC: 9.1 10*3/uL (ref 4.0–10.5)
WBC: 9.3 10*3/uL (ref 4.0–10.5)
WBC: 9.8 10*3/uL (ref 4.0–10.5)
WBC: 5.9 10*3/uL (ref 4.0–10.5)

## 2011-02-28 LAB — POCT I-STAT 3, ART BLOOD GAS (G3+)
Acid-base deficit: 1 mmol/L (ref 0.0–2.0)
Acid-base deficit: 2 mmol/L (ref 0.0–2.0)
Acid-base deficit: 2 mmol/L (ref 0.0–2.0)
Bicarbonate: 21.9 mEq/L (ref 20.0–24.0)
Bicarbonate: 22.9 mEq/L (ref 20.0–24.0)
Bicarbonate: 24.6 mEq/L — ABNORMAL HIGH (ref 20.0–24.0)
Bicarbonate: 25.2 mEq/L — ABNORMAL HIGH (ref 20.0–24.0)
Bicarbonate: 25.5 mEq/L — ABNORMAL HIGH (ref 20.0–24.0)
O2 Saturation: 83 %
O2 Saturation: 99 %
Patient temperature: 36.2
Patient temperature: 36.9
Patient temperature: 98.3
TCO2: 23 mmol/L (ref 0–100)
TCO2: 24 mmol/L (ref 0–100)
TCO2: 26 mmol/L (ref 0–100)
TCO2: 27 mmol/L (ref 0–100)
TCO2: 27 mmol/L (ref 0–100)
pCO2 arterial: 49.9 mmHg — ABNORMAL HIGH (ref 35.0–45.0)
pH, Arterial: 7.311 — ABNORMAL LOW (ref 7.350–7.450)
pH, Arterial: 7.407 (ref 7.350–7.450)
pH, Arterial: 7.419 (ref 7.350–7.450)
pO2, Arterial: 100 mmHg (ref 80.0–100.0)
pO2, Arterial: 152 mmHg — ABNORMAL HIGH (ref 80.0–100.0)

## 2011-02-28 LAB — COMPREHENSIVE METABOLIC PANEL
ALT: 11 U/L (ref 0–53)
AST: 17 U/L (ref 0–37)
Albumin: 3.2 g/dL — ABNORMAL LOW (ref 3.5–5.2)
Albumin: 3.3 g/dL — ABNORMAL LOW (ref 3.5–5.2)
Alkaline Phosphatase: 44 U/L (ref 39–117)
BUN: 12 mg/dL (ref 6–23)
BUN: 15 mg/dL (ref 6–23)
CO2: 25 mEq/L (ref 19–32)
Calcium: 8.8 mg/dL (ref 8.4–10.5)
Calcium: 9.1 mg/dL (ref 8.4–10.5)
Chloride: 107 mEq/L (ref 96–112)
Chloride: 98 mEq/L (ref 96–112)
Creatinine, Ser: 1.11 mg/dL (ref 0.4–1.5)
Creatinine, Ser: 1.23 mg/dL (ref 0.4–1.5)
GFR calc Af Amer: >60 mL/min (ref >60)
GFR calc non Af Amer: >60 mL/min (ref >60)
GFR calc non Af Amer: >60 mL/min (ref >60)
Glucose, Bld: 108 mg/dL — ABNORMAL HIGH (ref 70–99)
Potassium: 3.7 mEq/L (ref 3.5–5.1)
Sodium: 140 mEq/L (ref 135–145)
Total Bilirubin: 0.4 mg/dL (ref 0.3–1.2)
Total Bilirubin: 0.6 mg/dL (ref 0.3–1.2)
Total Protein: 6 g/dL (ref 6.0–8.3)

## 2011-02-28 LAB — CROSSMATCH
ABO/RH(D): A POS
Antibody Screen: NEGATIVE

## 2011-02-28 LAB — DIFFERENTIAL
Basophils Relative: 1 % (ref 0–1)
Basophils Relative: 1 % (ref 0–1)
Eosinophils Absolute: 0.2 10*3/uL (ref 0.0–0.7)
Eosinophils Relative: 2 % (ref 0–5)
Lymphs Abs: 0.8 10*3/uL (ref 0.7–4.0)
Lymphs Abs: 0.9 10*3/uL (ref 0.7–4.0)
Monocytes Absolute: 0.6 10*3/uL (ref 0.1–1.0)
Monocytes Relative: 7 % (ref 3–12)
Monocytes Relative: 8 % (ref 3–12)
Monocytes Relative: 12 % (ref 3–12)
Neutro Abs: 6.7 10*3/uL (ref 1.7–7.7)
Neutro Abs: 7.2 10*3/uL (ref 1.7–7.7)
Neutro Abs: 4 10*3/uL (ref 1.7–7.7)
Neutrophils Relative %: 79 % — ABNORMAL HIGH (ref 43–77)
Neutrophils Relative %: 68 % (ref 43–77)

## 2011-02-28 LAB — HEPARIN LEVEL (UNFRACTIONATED)
Heparin Unfractionated: 0.56 IU/mL (ref 0.30–0.70)
Heparin Unfractionated: 0.14 IU/mL — ABNORMAL LOW (ref 0.30–0.70)
Heparin Unfractionated: 0.33 IU/mL (ref 0.30–0.70)

## 2011-02-28 LAB — BLOOD GAS, ARTERIAL
Acid-Base Excess: 0.1 mmol/L (ref 0.0–2.0)
Bicarbonate: 23.8 mEq/L (ref 20.0–24.0)
Drawn by: 290241
FIO2: 0.21 %
O2 Saturation: 94.5 %
Patient temperature: 98.1
TCO2: 24.9 mmol/L (ref 0–100)
pCO2 arterial: 35.3 mmHg (ref 35.0–45.0)
pH, Arterial: 7.442 (ref 7.350–7.450)
pO2, Arterial: 67.5 mmHg — ABNORMAL LOW (ref 80.0–100.0)

## 2011-02-28 LAB — URINALYSIS, ROUTINE W REFLEX MICROSCOPIC
Bilirubin Urine: NEGATIVE
Hgb urine dipstick: NEGATIVE
Nitrite: NEGATIVE
Protein, ur: NEGATIVE mg/dL
Protein, ur: NEGATIVE mg/dL
Specific Gravity, Urine: 1.011 (ref 1.005–1.030)
Specific Gravity, Urine: 1.008 (ref 1.005–1.030)
Urobilinogen, UA: 1 mg/dL (ref 0.0–1.0)
Urobilinogen, UA: 1 mg/dL (ref 0.0–1.0)

## 2011-02-28 LAB — BASIC METABOLIC PANEL
BUN: 10 mg/dL (ref 6–23)
BUN: 11 mg/dL (ref 6–23)
BUN: 13 mg/dL (ref 6–23)
BUN: 14 mg/dL (ref 6–23)
BUN: 8 mg/dL (ref 6–23)
CO2: 22 mEq/L (ref 19–32)
CO2: 27 mEq/L (ref 19–32)
CO2: 27 mEq/L (ref 19–32)
CO2: 30 mEq/L (ref 19–32)
Calcium: 8.1 mg/dL — ABNORMAL LOW (ref 8.4–10.5)
Calcium: 8.6 mg/dL (ref 8.4–10.5)
Calcium: 8.6 mg/dL (ref 8.4–10.5)
Calcium: 8.8 mg/dL (ref 8.4–10.5)
Calcium: 8.9 mg/dL (ref 8.4–10.5)
Calcium: 9 mg/dL (ref 8.4–10.5)
Chloride: 102 mEq/L (ref 96–112)
Chloride: 102 mEq/L (ref 96–112)
Chloride: 103 mEq/L (ref 96–112)
Chloride: 105 mEq/L (ref 96–112)
Creatinine, Ser: 0.97 mg/dL (ref 0.4–1.5)
Creatinine, Ser: 1.01 mg/dL (ref 0.4–1.5)
Creatinine, Ser: 1.04 mg/dL (ref 0.4–1.5)
Creatinine, Ser: 1.04 mg/dL (ref 0.4–1.5)
Creatinine, Ser: 1.1 mg/dL (ref 0.4–1.5)
Creatinine, Ser: 1.18 mg/dL (ref 0.4–1.5)
GFR calc Af Amer: >60 mL/min (ref >60)
GFR calc Af Amer: >60 mL/min (ref >60)
GFR calc Af Amer: >60 mL/min (ref >60)
GFR calc Af Amer: >60 mL/min (ref >60)
GFR calc non Af Amer: >60 mL/min (ref >60)
GFR calc non Af Amer: >60 mL/min (ref >60)
GFR calc non Af Amer: >60 mL/min (ref >60)
GFR calc non Af Amer: >60 mL/min (ref >60)
Glucose, Bld: 118 mg/dL — ABNORMAL HIGH (ref 70–99)
Glucose, Bld: 124 mg/dL — ABNORMAL HIGH (ref 70–99)
Glucose, Bld: 128 mg/dL — ABNORMAL HIGH (ref 70–99)
Glucose, Bld: 133 mg/dL — ABNORMAL HIGH (ref 70–99)
Potassium: 4 mEq/L (ref 3.5–5.1)
Potassium: 4.1 mEq/L (ref 3.5–5.1)
Potassium: 4.2 mEq/L (ref 3.5–5.1)
Sodium: 134 mEq/L — ABNORMAL LOW (ref 135–145)
Sodium: 134 mEq/L — ABNORMAL LOW (ref 135–145)
Sodium: 137 mEq/L (ref 135–145)

## 2011-02-28 LAB — CREATININE, SERUM
Creatinine, Ser: 0.96 mg/dL (ref 0.4–1.5)
Creatinine, Ser: 1.1 mg/dL (ref 0.4–1.5)
GFR calc Af Amer: >60 mL/min (ref >60)
GFR calc Af Amer: >60 mL/min (ref >60)
GFR calc non Af Amer: >60 mL/min (ref >60)
GFR calc non Af Amer: >60 mL/min (ref >60)

## 2011-02-28 LAB — APTT
aPTT: 32 seconds (ref 24–37)
aPTT: 34 seconds (ref 24–37)
aPTT: 35 seconds (ref 24–37)
aPTT: 34 seconds (ref 24–37)

## 2011-02-28 LAB — POCT CARDIAC MARKERS
CKMB, poc: 1.4 ng/mL (ref 1.0–8.0)
CKMB, poc: <1 ng/mL — ABNORMAL LOW (ref 1.0–8.0)
Myoglobin, poc: 55 ng/mL (ref 12–200)
Troponin i, poc: 0.06 ng/mL (ref 0.00–0.09)
Troponin i, poc: <0.05 ng/mL (ref 0.00–0.09)

## 2011-02-28 LAB — POCT I-STAT 4, (NA,K, GLUC, HGB,HCT)
Glucose, Bld: 112 mg/dL — ABNORMAL HIGH (ref 70–99)
Glucose, Bld: 125 mg/dL — ABNORMAL HIGH (ref 70–99)
Glucose, Bld: 146 mg/dL — ABNORMAL HIGH (ref 70–99)
HCT: 34 % — ABNORMAL LOW (ref 39.0–52.0)
HCT: 35 % — ABNORMAL LOW (ref 39.0–52.0)
Hemoglobin: 11.6 g/dL — ABNORMAL LOW (ref 13.0–17.0)
Hemoglobin: 11.9 g/dL — ABNORMAL LOW (ref 13.0–17.0)
Hemoglobin: 12.6 g/dL — ABNORMAL LOW (ref 13.0–17.0)
Hemoglobin: 9.9 g/dL — ABNORMAL LOW (ref 13.0–17.0)
Potassium: 3.9 mEq/L (ref 3.5–5.1)
Potassium: 4.1 mEq/L (ref 3.5–5.1)
Potassium: 4.2 mEq/L (ref 3.5–5.1)
Potassium: 4.5 mEq/L (ref 3.5–5.1)
Sodium: 135 mEq/L (ref 135–145)
Sodium: 136 mEq/L (ref 135–145)
Sodium: 139 mEq/L (ref 135–145)

## 2011-02-28 LAB — MAGNESIUM
Magnesium: 2.1 mg/dL (ref 1.5–2.5)
Magnesium: 2.2 mg/dL (ref 1.5–2.5)
Magnesium: 2.5 mg/dL (ref 1.5–2.5)

## 2011-02-28 LAB — GLUCOSE, CAPILLARY
Glucose-Capillary: 111 mg/dL — ABNORMAL HIGH (ref 70–99)
Glucose-Capillary: 127 mg/dL — ABNORMAL HIGH (ref 70–99)
Glucose-Capillary: 76 mg/dL (ref 70–99)

## 2011-02-28 LAB — PROTIME-INR
INR: 1 (ref 0.00–1.49)
INR: 1.3 (ref 0.00–1.49)
INR: 1 (ref 0.00–1.49)
Prothrombin Time: 16.1 seconds — ABNORMAL HIGH (ref 11.6–15.2)
Prothrombin Time: 13.8 seconds (ref 11.6–15.2)

## 2011-02-28 LAB — POCT I-STAT 3, VENOUS BLOOD GAS (G3P V)
Bicarbonate: 24.7 mEq/L — ABNORMAL HIGH (ref 20.0–24.0)
pO2, Ven: 46 mmHg — ABNORMAL HIGH (ref 30.0–45.0)

## 2011-02-28 LAB — LIPID PANEL
LDL Cholesterol: 49 mg/dL (ref 0–99)
Total CHOL/HDL Ratio: 4.7 RATIO
Triglycerides: 127 mg/dL (ref <150)
VLDL: 38 mg/dL (ref 0–40)

## 2011-02-28 LAB — T4
T4, Total: 7.2 ug/dL (ref 5.0–12.5)
T4, Total: 7.8 ug/dL (ref 5.0–12.5)

## 2011-02-28 LAB — TSH: TSH: 34.459 u[IU]/mL — ABNORMAL HIGH (ref 0.350–4.500)

## 2011-02-28 LAB — CK TOTAL AND CKMB (NOT AT ARMC)
CK, MB: 1.7 ng/mL (ref 0.3–4.0)
Relative Index: INVALID (ref 0.0–2.5)
Total CK: 58 U/L (ref 7–232)

## 2011-02-28 LAB — TROPONIN I: Troponin I: 0.04 ng/mL (ref 0.00–0.06)

## 2011-02-28 LAB — PLATELET COUNT: Platelets: 154 10*3/uL (ref 150–400)

## 2011-02-28 LAB — BRAIN NATRIURETIC PEPTIDE: Pro B Natriuretic peptide (BNP): 424 pg/mL — ABNORMAL HIGH (ref 0.0–100.0)

## 2011-02-28 LAB — T3: T3, Total: 56.8 ng/dl — ABNORMAL LOW (ref 80.0–204.0)

## 2011-02-28 LAB — HEMOGLOBIN AND HEMATOCRIT, BLOOD: HCT: 28.1 % — ABNORMAL LOW (ref 39.0–52.0)

## 2011-04-06 NOTE — Discharge Summary (Signed)
NAME:  GARVIS, DOWNUM NO.:  [id number]   MEDICAL RECORD NO.:  [id number]          PATIENT TYPE:  INP   LOCATION:  2015                         FACILITY:  [facility]   PHYSICIAN:  Kerin Perna, M.D.  DATE OF BIRTH:  29-Dec-1945   DATE OF ADMISSION:  06/04/2009  DATE OF DISCHARGE:  06/12/2009                               DISCHARGE SUMMARY   ADDENDUM    Mr. [name] was felt to be tentatively stable for discharge on June 11, 2009.  At that time, he continued to have some premature ventricular  contractions on the monitor.  It was felt at that time that the  amiodarone should be stopped.  He was observed over the next day and his  beta-blocker was increased.  On June 12, 2009, he was reevaluated and  deemed to be acceptable for discharge.  Please see the full dictation  for further details.   Medications at discharge will include the following:  1. Synthroid 112 mcg daily.  2. As noted, the amiodarone has been stopped.  3. Aspirin 325 mg daily.  4. Lopressor 50 mg twice daily.  5. Lasix 40 mg daily for 5 days.  6. Potassium chloride 20 mEq daily for 5 days.  7. Crestor 10 mg daily.  8. For pain, oxycodone 5 mg 1-2 every 4-6 hours as needed for pain.      Rowe Clack, P.A.-C.      Kerin Perna, M.D.  Electronically Signed    WEG/MEDQ  D:  06/12/2009  T:  06/13/2009  Job:  [id number]   cc:   Cassell Clement, M.D.

## 2011-04-06 NOTE — Assessment & Plan Note (Signed)
OFFICE VISIT   [name], [name]  DOB:  1946/09/19                                        June 26, 2009  CHART #:  [id number]   CURRENT PROBLEMS:  1. Status post coronary artery bypass graft x5 on June 06, 2009, for      severe multivessel coronary artery disease of class IV unstable      angina.  2. Postoperative atrial fibrillation, transient.  3. Hypothyroidism.  4. History of squamous cell carcinoma of the hypopharynx, status post      surgery and radiation therapy.  5. Left bundle-branch block.   CURRENT MEDICATIONS:  Synthroid 125 mcg daily, aspirin 81 mg daily,  Lopressor 50 mg b.i.d., oxycodone p.r.n. pain, Crestor 10 mg daily, iron  sulfate 1 daily.   PRESENT ILLNESS:  The patient is a 65 year old gentleman, who returns  for his first office visit after undergoing bypass grafting in mid July  for unstable angina and severe multivessel disease including an occluded  circumflex marginal, 99% stenosis of the mid RCA, and 90% stenosis of  the LAD.  His EF was 45.  He underwent left IMA grafting to his LAD and  vein grafts to the ramus intermediate, obtuse marginal-2 and obtuse  marginal-3, and the posterior descending branch of right coronary.  He  had some transient atrial fibrillation and atrial and ventral ectopy,  which improved at the time of discharge on postoperative day #5.  Following discharge from the hospital, he required readmission on two  occasions for syncope at home.  This was usually postexercise, was not  associated with angina and was not orthostatic by history.  He underwent  multiple testing by the EP service including exercise tolerance test and  a neurologic evaluation with scans and arteriograms and all were  negative.  His cardiac enzymes were negative.  He has now been home for  3 days without recurrent symptoms.  He feels stronger and has been more  careful about his exercise duration.  Surgical incisions are healing   well.   PHYSICAL EXAMINATION:  VITAL SIGNS:  Blood pressure 110/70, pulse 80,  respirations 18, and saturation 98%.  GENERAL:  He is alert and appropriate.  LUNGS:  Breath sounds are clear and equal.  CHEST:  The sternum is stable and well healed.  CARDIAC:  Rhythm is regular without gallop, rub, or murmur.  EXTREMITIES:  The pulses in the extremities are intact and his leg  incision where the saphenous vein was harvested has also healed.  There  is no peripheral edema.  NEUROLOGIC:  Nonfocal.   PA and lateral chest x-ray today shows clear lung fields and stable  cardiac silhouette.  No pleural effusion.   IMPRESSION:  The patient is on an EKG recorder for the next 30 days, the  results of which will be sent to Dr. Johney Frame.  He wishes [place] go [place] for [place] and I feel this is appropriate.  He will  begin driving in another week.  He knows not to lift more than 20 pounds  until August 22, 2009, and to avoid the heat of the day at the beach and  to avoid going into the ocean until full 4 weeks after surgery.  He will  remain on his current medications and I will plan on  seeing him back in  early September for followup.   Kerin Perna, M.D.  Electronically Signed   PV/MEDQ  D:  06/26/2009  T:  06/27/2009  Job:  [id number]   cc:   Cassell Clement, M.D.  Vianne Bulls, M.D.

## 2011-04-06 NOTE — Procedures (Signed)
EEG NUMBER:  [id number].   CLINICAL HISTORY:  A 62-year patient being evaluated for syncopal  episodes.  Medication listed Lopressor, Crestor, aspirin, Pepcid,  levothyroxine, and heparin.   This is a 17-channel routine EEG recorded with the patient awake and  drowsy using standard 10/20 electrode placement.   Background awake rhythm consists of 8 Hz alpha which is of moderate  amplitude synchronous reactive to eye opening and closure.  No  paroxysmal epileptiform activity spikes or sharp waves are noted.  Length of the recording is 21.8 minutes.  Definite sleep stages are not  noted except mild drowsiness which is uneventful.  Hyperventilation and  photic stimulation are both unremarkable.  Technical component is  average.  EKG tracing is suboptimal.   IMPRESSION:  This EEG performed during awake and drowsy states is within  normal limits.  No definite epileptiform features were noted.           ______________________________  Sunny Schlein. Pearlean Brownie, MD     [id number]  D:  06/18/2009 16:16:25  T:  06/19/2009 45:40:98  Job #:  [id number]

## 2011-04-06 NOTE — Consult Note (Signed)
NAME:  [name], [name] NO.:  [id number]   MEDICAL RECORD NO.:  [id number]          PATIENT TYPE:  INP   LOCATION:  2028                         FACILITY:  [facility]   PHYSICIAN:  Marlan Palau, M.D.  DATE OF BIRTH:  1945-11-24   DATE OF CONSULTATION:  06/17/2009  DATE OF DISCHARGE:                                 CONSULTATION   REFERRING PHYSICIAN:  Cassell Clement, MD   CHIEF COMPLAINT:  Syncope.   HISTORY OF PRESENT ILLNESS:  This is a pleasant Caucasian 65 year old  male with hyperlipidemia, squamous cell carcinoma, status post  resection, chemotherapy and radiation, hypothyroidism, and CAD status  post CABG on June 06, 2009.  On July 23, the patient states that he woke  up from his sleep.  He went for an early morning walk as he was directed  to by Cardiac Rehab.  When he arrived home, he went into his bathroom,  he was organizing bathroom paraphernalia when he suddenly blacked out  and fell to the ground.  His wife apparently was in the other room,  heard the fall, immediately came to his aid.  According to his wife, the  patient was out for approximately 3-5 minutes.  There was no urinary  incontinence, tongue biting, or jerking movements.  The patient's eyes  were stated to be open during this episode, and the patient was  breathing comfortably.  The patient states that when he awoke, he had no  postictal state.  He was alert and he had knew what had happened.  EMS  was called.  The patient was brought to [facility] and at that  time, the patient's Lopressor was backed down to 25 mg b.i.d.  The  patient was subsequently sent home.  After his arrival in home on July  25th, the patient states he was relaxing comfortably throughout the day.  Later that evening, he gotten up, gone down a flight of stairs, to  answer a phone.  While he was talking on the phone, he realized he was  getting dizzy, went to go sit down and according to his wife, had told  him his legs buckled up underneath him and the patient fell to the  floor, again the patient woke soon after without any postictal phase.  The patient states on initial syncopal episode when he had awoke, he was  diaphoretic; however, his blood pressure which was obtained by EMS, was  within normal limits.  On the second episode of syncopal episode, the  patient does not recall being diaphoretic, however, does state that he  definitely was not confused when he had awoken from the fainting spell.  Again, during the second episode, no jerking or shaking, no tongue  biting, or urinary incontinence was noted.  Upon arrival to the ED on  July 25, the patient's blood pressure was stable at 161/90, temperature  97.8, pulse 82, and respirations 18.   While hospitalized during this hospitalization, the patient did have  orthostatic blood pressures obtained showing a drop in systolic blood  pressure from a lying to sitting position  of 18 and an increase in pulse  of 7.  From a sitting to a standing position, the patient's pulse  actually increased systolically by 10 and pulse increased by 8.   At the time of consultation, the patient is alert.  He is awake.  He is  well enunciated.  He is a good historian and having no complaints.  The  patient states that no times did he feel any chest pain, chest  discomfort, flip-flopping, or abnormal heartbeats.  He did not feel as  though he has any ora.  He did not sense any decreased vision, tunneled  vision, wooziness in the head, or disequilibrium.  The patient states  that these episodes are sudden with onset and just a sudden as they came  upon.  He was alert and oriented when he awoke.   PAST MEDICAL HISTORY:  1. Squamous cell carcinoma.  2. Hyperlipidemia.  3. Hypothyroidism.  4. CAD, status post CABG on June 06, 2009, first episode of syncope      occurred on July 23 and second episode of syncope occurred on July      25.   MEDICATIONS:  1.  Albuterol 2.5 mg inhale q.6 h.  2. Aspirin 325 mg p.o. daily.  3. Pepcid 20 mg p.o. b.i.d.  4. Ferrous sulfate 325 mg q.24 h.  5. Heparin 5000 units subcu q.8 h. for DVT prophylaxis.  6. Synthroid 125 mcg p.o. daily.  7. Lopressor 50 mg p.o. b.i.d.  8. Crestor 10 mg p.o. nightly.  9. Zofran 4 mg IV q.6 h. p.r.n.  10.Restoril 15-30 mg p.o. nightly p.r.n.  11.Oxycodone p.r.n.   ALLERGIES:  No known drug allergies.   SOCIAL HISTORY:  The patient does not drink, smoke, or do illicit drugs.  Prior to having his CABG, the patient was a Curator, works at  [employer], and lives with his wife.   REVIEW OF SYSTEMS:  Negative with the exception of above.   PHYSICAL EXAMINATION:  VITAL SIGNS:  Blood pressure is 110/78, pulse 88,  respiratory rate 18, and temperature 97.8.  MENTAL STATUS:  He is alert and oriented x3.  Carries out 2- to 3-step  commands.  He is well enunciated, has good demeanor, well dressed.  PULMONARY:  Clear to auscultation bilaterally.  No rhonchi or wheezing.  CARDIOVASCULAR:  S1 and S2, regular rate and rhythm.  NECK:  Negative for bruits and supple.  ABDOMEN:  Soft, nontender, and nondistended.  It should to be noted the  patient does have a large ecchymotic region around his periorbital  region of his left eye from the initial syncopal episode that was  documented on July 23.  NEUROLOGIC:  Pupils are equal, round, reactive accommodating to light,  conjugate.  Extraocular muscles are intact.  Visual fields grossly  intact.  Face symmetrical.  Tongue midline.  Uvula midline.  No  dysarthria, no aphasia, and no slurred speech.  Sensation V3 through V1  is fully intact to pinprick and light touch.  Shoulder shrug and head  turn fully intact.  Coordination; finger-to-nose, heel-to-shin, and fine  motor movements are all smooth with no dysmetria.  Gait; not tested.  Motor; 5/5 globally.  No tremors.  No asterixis.  No clonus.  The  patient's bulk and tone are  bilaterally within normal limits.  No  atrophy noted.  Deep tendon reflexes; 2+ throughout with downgoing toes  bilaterally.  Drift; negative in the upper and lower extremities.  Sensation; full to  pinprick, light touch, vibration, proprioception,  stereognosis.   LABORATORIES:  PT shows 13.7, PTT 32, and INR 1.0.  TSH is high at 34.45  and T3 equals 56.8.  UA was negative.  Sodium 132, potassium 4.1,  chloride 101, CO2 of 24, BUN 12, creatinine 1.04, and glucose 108.  White blood cell count 8.5, hemoglobin/hematocrit is 1.4 and 34.7  respectively, and platelets at 370.   IMAGING:  Head CT shows no acute intracranial abnormalities including  acute infarction, hemorrhage, mass lesion, or mass effect.  There is a  hematoma about the left eye, which is noted.   CT of C-spine shows no acute findings, autologous fusion of C2-3 due to  degenerative disk disease and C3 spondylosis.   ASSESSMENT AND PLAN:  A 65 year old male with significant coronary  artery disease and cardiac history presenting with 2 episodes of  syncope, which is most likely cardiogenic in origin.  However, although  most likely does not represent a seizure, we will obtain an EEG and also  obtain a CTA of head.  If both are negative, no further workup needed.  We will discuss these findings with Dr. Anne Hahn.   If negative, I see no need for further workup.  1. Continue with EEG.  2. CT of head.     ______________________________  Felicie Morn, PA-C      C. Lesia Sago, M.D.  Electronically Signed    DS/MEDQ  D:  06/17/2009  T:  06/18/2009  Job:  [id number]   cc:   Marlan Palau, M.D.

## 2011-04-06 NOTE — Discharge Summary (Signed)
NAME:  [name], [name] NO.:  [id number]   MEDICAL RECORD NO.:  [id number]          PATIENT TYPE:  INP   LOCATION:  2028                         FACILITY:  [facility]   PHYSICIAN:  Cassell Clement, M.D. DATE OF BIRTH:  29-Jun-1946   DATE OF ADMISSION:  06/15/2009  DATE OF DISCHARGE:  06/18/2009                               DISCHARGE SUMMARY   FINAL DIAGNOSES:  1. Vasodepressor syncope.  2. Recent coronary artery bypass graft surgery on June 06, 2009.  3. Hypothyroidism.  4. Postoperative left bundle-branch block.  5. Past history of squamous cell carcinoma of the neck.  6. Dyslipidemia.   OPERATIONS PERFORMED:  1. EP study on June 15, 2009, by Dr. Johney Frame.  2. Treadmill stress testing on June 17, 2009, by Dr. Johney Frame.  3. A CT angiogram of head.   HISTORY:  This is a 65 year old Caucasian gentleman, who was readmitted  on the afternoon of Sunday, September 25, after being discharged home  earlier that day.  He has had coronary artery bypass graft surgery on  June 06, 2009.  One day after his initial discharge, he came back after  having had syncope at home.  He was observed from July 23 through July  25 on telemetry and had no arrhythmias and was sent home with the plan  to get an outpatient monitor the next day.  However, after finishing a  walk and while talking on the phone at home, the patient had another  episode of syncope on the evening of July 25, and was readmitted to the  hospital for further inpatient management and workup.  On the first  episode of syncope, he had no warning.  In this time, he did have a few  seconds of feeling lightheaded and was in the process of trying to walk  toward a chair in the bed room when he collapsed and syncope.  He did  not have any chest pain.   PHYSICAL EXAMINATION ON ADMISSION:  VITAL SIGNS:  Blood pressure of  147/90, pulse was 84, normal sinus rhythm with PVCs.  The remainder of the exam was essentially unremarkable  except for recent  bruits in the left periorbital region from the initial episode of  syncope.   The patient was readmitted on telemetry.  There was no evidence of any  ischemia at the time of admission.  The patient was seen again by Dr.  Johney Frame of the [facility].  It was felt that an  EP study would be helpful to try to delineate the cause of the syncope.  The patient had a EP study on June 16, 2009, which found no inducible  supraventricular tachycardia, ventricular tachycardia, or ventricular  fibrillation on or off Isuprel.  There was a prolonged HV, but no  evidence of infrahisian block.  The patient had a carotid duplex, which  showed no internal carotid artery stenosis and the vertebrals were  antegrade.  The patient underwent an exercise test by Dr. Johney Frame on June 17, 2009.  The patient was able to exercise for 10-1/2 minutes on the  Bruce  protocol.  Reached a maximum heart rate of 131.  His blood  pressures were observed and were fine with exercise.  He was then  observed with prolonged standing and developed a hypotensive response  after 15-20 minutes of standing following exercise.  He became  diaphoretic and clammy, but did not have syncope.  It was felt that the  test was nondiagnostic for ischemia.  He did have isolated PVCs, but no  inducible arrhythmia during or after exercise.  His PVCs actually were  suppressed with exercise.  He was on Lopressor at the time of his stress  test.  The patient was also seen by Dr. Anne Hahn of Neurology.  Dr. Anne Hahn  felt that the episodes of syncope were most likely cardiogenic.  He did  recommend EEG, and a CT angiogram to complete the workup.  The CT  angiogram was negative, and the EEG was done on the morning of discharge  and was not reported back at the time of discharge.  By the afternoon of  July 28, the patient had remained stable, was walking in the hall  without difficulty, and it was felt that it would  be safe to discharge  him home.  On the way home, he will swing by our office to get a [employer]  [facility] outpatient monitor.   ACCESSORY LABORATORY STUDIES:  Hemoglobin 11.4 and hematocrit 34.7.  Sodium 132, potassium 4.1, BUN 12, creatinine 1.04, and blood sugar 108.  Clotting studies were normal.  Liver function studies were normal.  Albumin was 3.2.  Cardiac enzymes were negative x4.  His increased TSH  on July 24 was 31 and on July 25, was 17 with a T4 of 7.2 and T3 which  was low at 56.  The patient had been on Synthroid 112 mcg and were  increasing to 125 mcg in view of his high TSH, and this will require a  followup.   The patient is being discharged improved on a low-cholesterol diet.  He  may have dietary salt.  He was encouraged to drink plenty of fluids to  keep up his intravascular volume to help prevent vasodepressor syncope.  He will return to see Dr. Patty Sermons for followup office visit in  approximately 2 weeks.  He will also make arrangements to see Dr. Donata Clay and Dr. Vianne Bulls in followup.  He is to increase his activity  slowly.  He is to stop Lasix and stop potassium.   He will be on the following medications:  1. Metoprolol 50 mg twice a day.  2. Crestor 10 mg daily.  3. Coated aspirin 325 daily.  4. Synthroid 125 mcg daily.  5. Ferrous sulfate 325 daily.  6. Nitrostat 0.4 mg sublingually p.r.n.  7. Tylenol 325 if needed for pain.   He is to get some knee length support hose to wear during the day.  He  is to avoid prolonged standing in one place and he is to drink plenty of  fluids.   Time spent in patient counseling and preparation of discharge papers,  greater than 40 minutes.   CONDITION ON DISCHARGE:  Improved.           ______________________________  Cassell Clement, M.D.     TB/MEDQ  D:  06/18/2009  T:  06/19/2009  Job:  [id number]   cc:   Kerin Perna, M.D.  Hillis Range, MD  C. Lesia Sago, M.D.  Vianne Bulls, M.D.

## 2011-04-06 NOTE — Op Note (Signed)
NAME:  DREY, SHAFF NO.:  [id number]   MEDICAL RECORD NO.:  [id number]          PATIENT TYPE:  INP   LOCATION:  2028                         FACILITY:  [facility]   PHYSICIAN:  Hillis Range, MD       DATE OF BIRTH:  04/22/1946   DATE OF PROCEDURE:  DATE OF DISCHARGE:                               OPERATIVE REPORT   SURGEON:  Hillis Range, MD   PREPROCEDURE DIAGNOSIS:  Unexplained syncope.   POSTPROCEDURE DIAGNOSES:  Unexplained syncope.   PROCEDURES:  1. Comprehensive electrophysiology study.  2. Coronary sinus pacing recording.  3. Isoproterenol infusion with pacing after drug infusion.   INTRODUCTION:  Mr. Burlingame is a pleasant 65 year old gentleman who  presents with recurrent unexplained syncope.  He recently was found to  have three-vessel coronary artery disease and underwent CABG on June 06, 2009.  His postoperative state was initially uneventful.  He was  discharged home and had unexplained abrupt syncope on June 10, 2009.  He  was found to have orthostasis, but no other significant arrhythmias were  observed and he was discharged.  Approximately, 4 hours after discharge  on June 15, 2009, he had second episode of syncope at home which was  again abrupt while at rest.  He was readmitted and now presents for EP  study.   DESCRIPTION OF PROCEDURE:  Informed written consent was obtained and the  patient was brought to the Electrophysiology Lab in a fasting state.  He  was adequately sedated with intravenous Versed and fentanyl as outlined  in the nursing report.  The patient's right and left groins were prepped  and draped in the usual sterile fashion by the EP Lab staff.  The  patient was noted to have marked ecchymoses along the right groin status  post fall.  I therefore did not make any attempts to access the right  groin.  The left common femoral vein was accessed using a modified  percutaneous Seldinger technique.  Through the left common femoral  vein,  three 6-French hemostasis sheaths were advanced.  Two 6-French  quadripolar Josephson catheter was introduced through the left common  femoral vein and advanced into the His bundle and right ventricular apex  positions respectively.  A 6-French decapolar Polaris X catheter was  introduced through the right common femoral vein and advanced into the  coronary sinus for recording and pacing.  The patient presented to the  Electrophysiology Lab in normal sinus rhythm.  He was noted to have a  left bundle-branch block upon presentation.  His RR interval measured  180 milliseconds with a QRS duration of 166 msec and a QT interval 430  msec.  His AH interval measured 78 msec with an HV interval of 70 msec.  Ventricular pacing was performed from the right ventricular apex which  revealed VA dissociation at a basic cycle length of 600 milliseconds.  Rapid atrial pacing was performed from the left atrium which revealed  decremental AV conduction with no evidence of PR greater than RR and no  tachycardia is induced.  The AV Wenckebach cycle length  was 470 msec.  Atrial extrastimulus testing was performed which revealed decremental AV  conduction with an AH jump, but no echo beats and no tachycardia is  observed.  There was no block with atrial pacing below the His.  All AV  block appeared to be within the AV node and appeared to be physiologic.  Programmed extrastimulus testing was then performed from the right  ventricular apex with S1-S2, S3-S4 extrastimuli down to refractoriness  with a basic cycle length of 500 msec and also 450 msec.  The patient  had no ventricular arrhythmias observed with a basic cycle length of 500  msec down to 500/250/240/200 msec.  No ventricular arrhythmias were  observed with pacing at a basic cycle length of 450 msec down to  450/250/240/200 msec.  Long short coupling sequences were then provided  at a basic cycle length of 500 msec down to 500/600/240 msec with  no  ventricular arrhythmias induced.  The ventricular catheter was then  repositioned within the right ventricular outflow tract and additional  programmed extrastimulus testing was performed at a basic cycle length  of 500 and 600 msec.  No ventricular arrhythmias were induced with S1,  S2, S3, S4 extrastimuli down to refractoriness.  When pacing at a basic  cycle length of 600 msec down to 600/280/260/200 msec, no tachycardias  were observed.  When pacing at a basic cycle length of 500 msec down to  500/280/260/200 msec. no tachycardias were observed.  Isoproterenol was  therefore infused up to 2 mcg per minute with an adequate increase in  heart rate response observed.  Rapid atrial pacing was again performed  which revealed decremental AV conduction with no evidence of PR greater  than RR and no infra-Hisian blocks observed.  The AV Wenckebach cycle  length was 360 msec.  Atrial extrastimulus testing was again performed  which revealed decremental AV conduction with no AH jumps, echo beats,  or tachycardias observed.  The AV nodal ERP was 500/280 msec with block  within the AV node.  Ventricular pacing was performed during  isoproterenol washout down to a cycle length of 300 msec with no  ventricular arrhythmias induced.  The procedure was therefore considered  completed.  All catheters were removed and sheaths were aspirated and  flushed.  The sheaths were removed and hemostasis was assured.  There  were no early apparent complications.   CONCLUSIONS:  1. Sinus rhythm with a left bundle-branch block upon presentation.  2. Mild prolongation of the HV interval with no evidence of infra-      Hisian block.  3. No supraventricular tachycardia, ventricular tachycardia, or      ventricular fibrillation induced.  4. Dual AV nodal physiology is present, however, there is no evidence      of AV nodal reentrant tachycardia.  5. There are no accessory pathways.  6. No inducible arrhythmias  with isoproterenol infusion.  7. No early apparent complications.      Hillis Range, MD  Electronically Signed     JA/MEDQ  D:  06/16/2009  T:  06/17/2009  Job:  [id number]   cc:   Cassell Clement, M.D.

## 2011-04-06 NOTE — H&P (Signed)
NAME:  [name], [name] NO.:  [id number]   MEDICAL RECORD NO.:  [id number]          PATIENT TYPE:  INP   LOCATION:  4735                         FACILITY:  [facility]   PHYSICIAN:  Cassell Clement, M.D. DATE OF BIRTH:  Apr 10, 1946   DATE OF ADMISSION:  06/13/2009  DATE OF DISCHARGE:                              HISTORY & PHYSICAL   CHIEF COMPLAINT:  Syncope.   HISTORY:  This is a 65 year old married Caucasian gentleman who was  admitted here on June 04, 2009, and was discharged from the hospital on  June 12, 2009, which was yesterday.  He had been hospitalized initially  for chest tightness.  He underwent cardiac catheterization on June 05, 2009, by Dr. Swaziland and was found to have severe three-vessel coronary  disease and complex coronary artery anatomy. and it was felt that the  patient will need coronary artery bypass graft surgery.  He was seen at  same day by Dr. Donata Clay and was taken to the operating room by Dr. Donata Clay on June 06, 2009, where he underwent coronary artery bypass  grafting x5 with left internal mammary artery to the LAD, a saphenous  vein graft to the ramus intermedius, a sequential saphenous vein graft  to the obtuse marginal II and the obtuse marginal artery III, and a  saphenous vein graft to posterior descending.  He also underwent  endoscopic harvest of the right leg greater saphenous vein.  The patient  did well postoperatively.  He did have postoperative atrial  fibrillation, which responded to amiodarone.  The amiodarone was stopped  prior to discharge.  The patient had uneventful remainder of the  hospital course and was discharged yesterday on Synthroid 112 mcg daily,  aspirin 325 mg daily, Lopressor 50 mg twice a day, Lasix 40 mg daily for  5 days, potassium chloride 20 mEq daily for 5 days, Crestor 10 mg daily,  and oxycodone 5 mg 1 to 2 every 4-6 hours p.r.n. for pain.  He had taken  some oxycodone last night.  This morning he  awoke and went for early  morning walk before it got too hot.  He walked for about 10 minutes.  He  came in and was preparing to take a shower.  He was in the bathroom and  suddenly lost consciousness.  His wife heard him fall.  He was  unresponsive when she first got there and then came around.  He did  suffer a blunt trauma to the left eyebrow area with ecchymosis.  The  patient denied any chest pain or left arm pain.  He had been feeling  well on his walk this morning and had no difficulty.   REVIEW OF SYSTEMS:  The patient has not had any recent gastrointestinal  or genitourinary symptoms.  He is not having any fever, chill, or night  sweats.  He is not having any pleuritic chest pain.  He has had a slight  cough which is nonproductive.   Remainder of systems all negative in detail.   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  Postoperatively after that surgery, he had to be fed with  a feeding tube for 6 months, but then recovered and now eats normally.  The patient also had a broken wrist in the past, treated by Dr. Eulah Pont.  He had a herniorrhaphy in 2008 by Dr. Ovidio Kin.   PAST MEDICAL HISTORY:  Hypothyroidism and the patient is on thyroid  replacement.   The patient does have a history of hyperlipidemia and was recently  started on Crestor.   SOCIAL HISTORY:  He is a Psychologist, occupational.  He does not smoke.   FAMILY HISTORY:  His father died of heart attack at age 64 and the  patient's mother died of ovarian cancer.  The patient is married.  He  has 2 twin sons, they are 24 years old.   PHYSICAL EXAMINATION:  VITAL SIGNS:  In the emergency room, his blood  pressure was 124/85; pulse is 72, regular; respirations are normal.  GENERAL APPEARANCE:  A well-developed, well-nourished gentleman in no  acute distress.  He does have an ecchymosis over the left eye.  He is  alert and oriented.  HEENT:  Pupils were equal and reactive.  Sclerae  nonicteric.  Mouth and  pharynx normal.  NECK:  Jugular venous pressure normal.  Thyroid normal.  Carotids, no  bruits.  CHEST:  Clear to percussion and auscultation.  HEART:  A quiet precordium without murmur, gallop, rub, or click.  The  sternal incision is healing well.  There is no evidence of any  infection.  SKIN:  No rash.  ABDOMEN:  Soft and nontender.  The liver and spleen are not enlarged.  EXTREMITIES:  Femoral pulses and pedal pulses are good.  There is no  phlebitis or edema.  NEUROLOGIC:  Physiologic.  INTEGUMENT:  No rash.   His electrocardiogram shows normal sinus rhythm.  He has a left bundle-  branch block which was present preoperatively.  Since the tracing of  June 09, 2009, the QRS axis is no longer leftward.  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  The portable chest x-ray showed a stable  cardiomediastinal silhouette with a small right pleural effusion with  small right basilar atelectasis but no pulmonary edema.   Initial lab work including CBC, B-Met, and initial cardiac enzymes are  negative.   IMPRESSION:  Syncope, uncertain etiology, most likely vasovagal or  orthostatic.  We will also need to rule out other types of syncope such  as ventricular arrhythmia since he does have a past history of premature  ventricular contractions.   DISPOSITION:  We are admitting to telemetry.  We will get serial  enzymes.  We will check orthostatic blood pressures.  We will hold his  Lasix, and we are checking a B natriuretic peptide.  We will reduce his  Lopressor back to 25 mg b.i.d. and give parameters when it will be held.  I appreciate the consult of EP tonight.  Dr. Swaziland will follow up with  the patient over the weekend.            ______________________________  Cassell Clement, M.D.     TB/MEDQ  D:  06/13/2009  T:  06/14/2009  Job:  [id number]   cc:   Kerin Perna, M.D.  Hillis Range, MD  C. Duane Lope, M.D.

## 2011-04-06 NOTE — Op Note (Signed)
NAME:  [name], DIGUGLIELMO NO.:  [id number]   MEDICAL RECORD NO.:  [id number]           PATIENT TYPE:   LOCATION:                                 FACILITY:   PHYSICIAN:  Kerin Perna, M.D.  DATE OF BIRTH:  1946-01-31   DATE OF PROCEDURE:  06/06/2009  DATE OF DISCHARGE:                               OPERATIVE REPORT   OPERATIONS:  1. Coronary artery bypass grafting x5 (left internal mammary artery to      left anterior descending (coronary artery), saphenous vein graft to      ramus intermediate, sequential saphenous vein graft to obtuse      marginal artery 2 and obtuse marginal artery 3, saphenous vein      graft to posterior descending).  2. Endoscopic harvest of right leg greater saphenous vein.   SURGEON:  Kerin Perna, MD   ASSISTANT:  Doree Fudge, PA   ANESTHESIA:  General.   PREOPERATIVE DIAGNOSIS:  Severe multivessel coronary artery disease with  class IV unstable angina.   POSTOPERATIVE DIAGNOSIS:  Severe multivessel coronary artery disease  with class IV unstable angina.   CLINICAL NOTE:  The patient is a 65 year old Caucasian male with history  of head and neck cancer who presented with recent progressive and  accelerating chest pain.  An urgent cath demonstrated severe multivessel  disease with occluded distal circumflex marginal, 99% stenosis of the  mid RCA, and 90% stenosis of the proximal LAD.  His EF was 45 with  inferior wall hypokinesia and he was felt to be a candidate for urgent  surgical revascularization.  I reexamined the patient in his hospital  room and reviewed the results of the cardiac cath with the patient and  his family.  I discussed the indications and expected benefits of  coronary artery bypass surgery for treatment of his coronary artery  disease.  I reviewed the alternatives to surgical therapy as well.  I  discussed the major issues of surgery including the choice of conduit to  include internal mammary  artery and endoscopically harvested saphenous  vein, the location of the surgical incisions, and use of general  anesthesia, and cardiopulmonary bypass.  I reviewed with him the risks  including the risks of MI, CVA, bleeding, infection, and death.  After  reviewing these issues, he demonstrated his understanding and agreed to  proceed with the surgery under what I felt was an informed consent.   PROCEDURE:  The patient was brought to the operative room, placed supine  on the operating table where general anesthesia was induced.  The  transesophageal 2D echo probe was placed by Dr. Noreene Larsson, which  demonstrated fairly good preserved LV function without significant  valvular disease.  The sternal incision was made as the saphenous vein  was harvested endoscopically from the right leg.  The internal mammary  artery was harvested as a pedicle graft from its origin at the  subclavian vessels.  The sternal retractor was placed and the  pericardium was opened and suspended.  The pursestrings were placed in  the ascending aorta and right  atrium, and heparin was administered after  the vein was harvested and inspected and found to be adequate.  The  patient was then cannulated and placed on cardiopulmonary bypass.  The  coronaries were identified for grafting.  Of note, the diagonal branch  to LAD was too small to graft and a ramus branch of the left coronary  was found to be a 1.5-mm vessel with a proximal 80-90% stenosis.  Mammary artery and vein grafts were prepared for the distal anastomoses  and cardioplegia catheters were placed for both antegrade and retrograde  cold blood cardioplegia.  The patient was cooled to 32 degrees and  aortic crossclamp was applied.  1 L of cold blood cardioplegia was  delivered in split doses between the antegrade aortic and retrograde  coronary sinus catheters.  There was good cardioplegic arrest and septal  temperature dropped to less than 12 degrees.   Cardioplegia was delivered  every 20 minutes or less while the crossclamp was applied.   The distal coronary anastomoses were then performed.  The first distal  anastomosis was the posterior descending branch of the right coronary.  He had a proximal 99% stenosis.  Reverse saphenous vein was sewn end-to-  side with running 7-0 Prolene with good flow through the graft.  The  second distal anastomosis was the ramus branch of the left coronary.  It  was 1.5-mm vessel with proximal 80% stenosis and a reverse saphenous  vein was sewn end-to-side with running 7-0 Prolene.  The third and  fourth distal anastomoses consisted a sequential vein graft to the OM2  and OM3.  The OM2 had a proximal 90% stenosis and a side-to-side  anastomosis where the vein was constructed with a running 7-0 Prolene.  This was continued in a sequential fashion to the OM3, which was  occluded proximally.  It was a 1.7-mm vessel.  The end of the vein was  sewn end-to-side with running 7-0 Prolene.  There was good flow through  the graft.  Cardioplegia was redosed.  The fifth distal anastomosis was  the distal third of the LAD.  It was more intramyocardial proximally.  The left IMA pedicle was brought through an opening created and the left  lateral pericardium was brought down onto the LAD and sewn end-to-side  with running 8-0 Prolene.  There was good flow through the anastomosis  after briefly releasing the pedicle bulldog on the mammary artery.  The  bulldog was reapplied and the pedicle was secured to epicardium with 6-0  Prolene.  Cardioplegia was redosed.   While the crossclamp was still in place, 3 proximal vein anastomoses  were performed on the ascending aorta using a 4.0-mm punch running 6-0  Prolene.  Prior to tying down the final proximal anastomosis, air was  vented from the coronaries with a dose of retrograde warm blood  cardioplegia.   The crossclamp was removed.  The heart was cardioverted back to a   regular rhythm.  Air was aspirated from the vein grafts and each were  opened.  There was good flow through each graft.  Proximal and distal  anastomoses were checked and found to be hemostatic.  The cardioplegia  catheters were removed.  The patient was rewarmed to 37 degrees.  The  lungs re-expanded and the ventilator was resumed.  The patient was then  weaned from bypass without difficulty maintaining stable hemodynamics.  Protamine was administered without adverse reaction.  The cannulas were  removed.  The mediastinum was irrigated with  warm antibiotic irrigation.  The superior pericardial fat was closed over the aorta.  Two mediastinal  and a left pleural chest tube were placed and brought through separate  incisions.  The sternum was closed with interrupted steel wire.  The  pectoralis fascia was closed with a running #1 Vicryl.  Subcutaneous and  skin layers were closed in running Vicryl and sterile dressings were  applied.  Total bypass time was 100 and 20 minutes.      Kerin Perna, M.D.  Electronically Signed     PV/MEDQ  D:  06/06/2009  T:  06/07/2009  Job:  [id number]   cc:   Peter M. Swazil[place], M.D.  Cassell Clement, M.D.

## 2011-04-06 NOTE — Consult Note (Signed)
NAME:  [name], [name] NO.:  [id number]   MEDICAL RECORD NO.:  [id number]          PATIENT TYPE:  INP   LOCATION:  4735                         FACILITY:  [facility]   PHYSICIAN:  Hillis Range, MD       DATE OF BIRTH:  06-27-1946   DATE OF CONSULTATION:  06/14/2009  DATE OF DISCHARGE:                                 CONSULTATION   REQUESTING PHYSICIAN:  Cassell Clement, MD   REASON FOR CONSULTATION:  Syncope.   HISTORY OF PRESENT ILLNESS:  Mr. [name] is a pleasant 65 year old  gentleman with a history of coronary artery disease, status post recent  CABG, who is admitted with syncope.  The patient reports recently being  discharged following his CABG procedure.  He did well initially  following recovery.  He reports that on July 20, he felt to be in his  usual state of health.  He went for a 10-minute walk early in the  morning without any symptoms of chest pain, shortness of breath,  palpitations, presyncope, or syncope.  He notes that it was somewhat  warmoutside.  He then went to his home and sat down for approximately  10 minutes to recover.  He subsequently rose and ambulated into his  bathroom to take a shower.  Upon arriving to the bathroom, he reports  having loss of consciousness.  He collapsed to the floor without  warning.  The patient's spouse came into the room and found him to be  unresponsive, but appeared to have his eyes open and was breathing.  It  took him several minutes to have return of his composure.  He did not  require any resuscitation efforts by his spouse.  He subsequently  returned to his usual health state.  He denies incontinence, tongue  biting, and no seizure activity was observed by his spouse.  He was  brought to [facility] where he was observed to have sinus  rhythm with premature ventricular contractions on the monitor.  He was  found to be orthostatic upon admission to the hospital.  His Lasix was  therefore  discontinued, and he was initiated on intravenous fluid.  He  has done well since that time.  He has been observed on telemetry to  have sinus rhythm with PVCs, but no sustained or nonsustained  ventricular arrhythmias, otherwise.  He has had no significant brady  episodes or pauses.  Presently, he is resting comfortably, and is  otherwise without complaint.   PAST MEDICAL HISTORY:  1. Coronary artery disease, status post five-vessel CABG by Dr. Donata Clay on June 06, 2009.  2. Hypothyroidism.  3. 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  4. Intraventricular conduction delay.  5. Hyperlipidemia.   ALLERGIES:  No known drug allergies.   HOME MEDICATIONS:  1. Crestor 10 mg daily.  2. Lasix 40 mg daily.  3. Potassium chloride 20 mEq daily.  4. Synthroid 112 mcg daily.  5. Metoprolol 50 mg b.i.d.  6. Aspirin 325 mg.  7. Oxycodone 5 mg q.4 h. p.r.n.   SOCIAL HISTORY:  The patient lives with his spouse in [place].  He  currently works at [employer].  He denies tobacco, alcohol, or drug  use.   FAMILY HISTORY:  Notable for coronary artery disease and ovarian cancer.  He denies any family history of arrhythmias.   REVIEW OF SYSTEMS:  All systems were reviewed and negative except as  outlined in the HPI above.   STUDIES:  Left heart catheterization on July 15, revealed severe three-  vessel coronary artery disease.  The patient was noted to have  hypokinesis involving the base to mid wall of the inferior left  ventricle with mildly reduced ejection fraction of 45% with no  significant mitral insufficiency.   Following revascularization, an echocardiogram was performed on June 09, 2009, revealed a left ventricular end-diastolic dimension of 58, left  ventricular ejection fraction of 55%, septal motion consistent with  bypass  surgery, otherwise, no significant wall motion abnormalities.  A  trivial pericardial effusion was observed without any evidence of  tamponade.   PHYSICAL EXAMINATION:  VITAL SIGNS:  Blood pressure 114/76, heart rate  77, respirations 20, sats 94% on room air, and afebrile.  GENERAL:  The patient is a well-appearing male, in no acute distress.  He is alert and oriented x3.  HEENT:  The patient has ecchymosis over the left forehead and  periorbital region, status post fall.  Sclerae clear.  Conjunctivae  pink.  Oropharynx clear.  NECK:  Supple.  No thyromegaly, JVD, or bruits.  Carotid massage  maneuver is negative.  LUNGS:  Clear to auscultation bilaterally.  HEART:  Regular rate and rhythm.  No murmurs, rubs, or gallops.  GI:  Soft, nontender, and nondistended.  Positive bowel sounds.  EXTREMITIES:  No clubbing, cyanosis, or edema.  NEUROLOGIC:  Cranial nerves II through XII are intact.  Strength and  sensation are intact.  SKIN:  No ecchymoses except as noted above along the left forehead.  No  other lacerations.  MUSCULOSKELETAL:  No deformity or atrophy.  PSYCH:  Euthymic mood.  Full affect.   Telemetry reveals sinus rhythm with premature ventricular contractions.  There are no nonsustained ventricular tachycardia, ventricular  tachycardia, supraventricular tachycardia, significant brady  arrhythmias, or pauses observed.   Head CT on July 23, reveals no acute findings.   Chest x-ray from July 23 reveals a small right pleural effusion with  right basilar atelectasis, otherwise unremarkable.   LABORATORIES:  BNP 424.  Creatinine 1.2.  Hematocrit 35.8 and platelets  342.   IMPRESSION:  Mr. [name] is a very pleasant 65 year old gentleman with a  history of recently diagnosed coronary artery disease, who is recovering  status post five-vessel coronary artery bypass graft.  He has been  observed to have a syncopal episode in the setting of orthostasis.  Though we cannot  completely clear the cause of his syncopal episode,  orthostasis is likely.  The patient is currently receiving intravenous  fluid.  On review of the patient's echocardiogram, he was observed to  have mild-to-moderate reduction of his left ventricular ejection  fraction with regional wall motion abnormalities prior to his  revascularization, however, subsequent revascularization, his ejection  fraction has normalized and he only has paradoxical septal wall motion.  I think that this makes our concern for sustained ventricular arrhythmia  is less likely.  Nevertheless, should  the patient have any episodes of  nonsustained ventricular tachycardias or ventricular tachycardias  observed, recurrence of syncope, or other concerns for arrhythmia, then  I will consider an EP study as the next available approach.  However,  the patient does well without any other ventricular arrhythmias, then I  think that he could be discharged with close outpatient followup.  He  should be continued on a beta-blocker at long term.  I would recommend  that the patient to have a 21-day monitor placed at this time to  evaluate for further arrhythmias.  He is aware that he has been advised  by me to not drive for 6 months according to the [facility] recommendation.      Hillis Range, MD  Electronically Signed     JA/MEDQ  D:  06/14/2009  T:  06/14/2009  Job:  [id number]   cc:   Cassell Clement, M.D.

## 2011-04-06 NOTE — Discharge Summary (Signed)
NAME:  [name], [name] NO.:  [id number]   MEDICAL RECORD NO.:  [id number]          PATIENT TYPE:  INP   LOCATION:  2015                         FACILITY:  [facility]   PHYSICIAN:  Kerin Perna, M.D.  DATE OF BIRTH:  Jun 06, 1946   DATE OF ADMISSION:  06/04/2009  DATE OF DISCHARGE:                               DISCHARGE SUMMARY   PRIMARY ADMITTING DIAGNOSIS:  Chest pain.   ADDITIONAL/DISCHARGE DIAGNOSES:  1. Severe three-vessel coronary artery disease.  2. Class IV unstable angina.  3. Postoperative atrial fibrillation.  4. Recently diagnosed hypothyroidism.  5. 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  6. Left bundle-branch block.   PROCEDURES PERFORMED:  1. Cardiac catheterization.  2. Coronary artery bypass grafting x5 (left internal mammary artery to      the left anterior descending, saphenous vein graft to the ramus      intermedius, sequential saphenous vein graft to the second and      third obtuse marginals, saphenous vein graft to the posterior      descending).  3. Endoscopic vein harvest, right leg.   HISTORY:  The patient is a 65 year old male who over the past month has  had episodic exertional chest discomfort, which is relieved with rest.  On the date of this admission, he was seen in Dr. Isaias Cowman Ross's office  complaining of chest discomfort and was subsequently dispatched to the  emergency department for further workup.  He was seen by Dr. Patty Sermons  and was subsequently admitted.  Initially on EKG in the ER, he was noted  to have T-wave inversions in lead III, which was new from a prior EKG.   HOSPITAL COURSE:  [name] was admitted for further workup.  He was  started on IV heparin as well as aspirin and a beta-blocker.  His  troponin on admission was 0.23 with a CK-MB of 1.6.  He underwent  cardiac catheterization on June 05, 2009 by Dr. Peter Swaziland  and was  found to have a 90% proximal LAD stenosis with a 70% first diagonal, 80%  circumflex prior to the second obtuse marginal, a 90% OM-2, a 100% large  OM-3, which filled by collaterals, 60% mid RCA followed by a 99%  stenosis, and EF of 45%.  Because of his symptoms and his coronary  anatomy, he was felt to be a candidate for surgical revascularization.  He was seen by Dr. Kathlee Nations Trigt and films were reviewed and Dr. Donata Clay agreed with the need for surgery.  He explained all risks,  benefits, and alternatives of surgery to the patient and he agreed to  proceed.  He was taken to the operating room on June 06, 2009 and  underwent a CABG x5 as described above.  Please see previously dictated  operative report for complete details of surgery.  He tolerated the  procedure well and was transferred to the SICU in stable condition.  He  was extubated shortly after surgery.  He was hemodynamically stable  and  doing well on postop day #1.  He was maintained on p.o. amiodarone as he  had developed some atrial fibrillation in the operating room.  He also  was noted to have a pericardial friction rub on postop day #1.  This was  treated conservatively and subsequently has resolved.  He remained in  the unit until postop day #2, at which point, he was able to be  transferred to the step-down unit.  Overall, his postoperative course  has been uneventful.  He has had no further atrial fibrillation.  He did  have one episode on the evening of postop day #3 and early morning of  postop day #4, which he was noted to have a heart rate in the telemetry  monitor in the 20s.  The patient was sleeping at that time and was  asymptomatic.  The strips were reviewed by the cardiologist who felt  that the computer had misinterpreted the QRS complexes as P-waves and  PVCs as QRS complexes, which resulted in a factitious bradycardic heart  rate.  An EKG performed immediately after this was noted showed  normal  sinus rhythm with heart rates in the 60s to 70s as well as a left bundle-  branch block and frequent PVCs.  A 2-D echocardiogram was also  performed, which showed no significant pericardial effusion.  He has  been observed over the ensuing 24 hours and has had no further  arrhythmias.  He is making slow and steady progress.  He has been  diuresed back down to approximately 2 kg above his preoperative weight  with mild lower extremity edema on physical exam.  His incisions are all  healing well.  He has remained afebrile and his other vital signs have  been stable.  He is tolerating regular diet.  He is ambulating with  cardiac rehab phase one as well as independently and is doing well.  His  most recent labs on postop day #4 show a sodium of 137, potassium 4.1,  BUN 11, creatinine 1.01.  Hemoglobin 9.6, hematocrit 28.5, white count  9.3, platelets 165.  His most recent chest x-ray showed a tiny right  apical pneumothorax of less than 5% with some mild bibasilar  atelectasis.  It is felt that if he continues to remain stable over the  next 24 hours, he will hopefully be ready for discharge home on June 11, 2009.   Discharge medications are as follows:  1. Enteric-coated aspirin 325 mg daily.  2. Amiodarone 400 mg b.i.d. x1 week, then 400 mg daily.  3. Lopressor 25 mg b.i.d.  4. Lasix 40 mg daily x5 days.  5. Potassium 20 mEq daily x5 days.  6. Crestor 10 mg at bedtime.  7. Oxycodone 5 mg one to two q.4-6 h. p.r.n. for pain.  8. Synthroid 112 mcg daily.   DISCHARGE INSTRUCTIONS:  He is asked to refrain from driving, heavy  lifting, or strenuous activity.  He may continue ambulating daily and  using his incentive spirometer.  He may shower daily and clean his  incisions with soap and water.  He will continue a low-fat, low-sodium  diet.   DISCHARGE FOLLOWUP:  He will see Dr. Patty Sermons back in the office in 2  weeks.  He will then follow up with Dr. Donata Clay on June 26, 2009 with  a chest x-ray from [facility].  If he experiences any further  problems or has questions in the interim, he is asked to  contact our  office immediately.      Coral Ceo, P.A.      Kerin Perna, M.D.  Electronically Signed    GC/MEDQ  D:  06/10/2009  T:  06/11/2009  Job:  [id number]   cc:   Miguel Aschoff, M.D.  Cassell Clement, M.D.  [facility] Office

## 2011-04-06 NOTE — Consult Note (Signed)
NAME:  BANKS, CHAIKIN NO.:  [id number]   MEDICAL RECORD NO.:  [id number]          PATIENT TYPE:  INP   LOCATION:  2030                         FACILITY:  [facility]   PHYSICIAN:  Kerin Perna, M.D.  DATE OF BIRTH:  1946/10/30   DATE OF CONSULTATION:  06/05/2009  DATE OF DISCHARGE:                                 CONSULTATION   PHYSICIAN REQUESTING CONSULTATION:  Cassell Clement, MD.   PRIMARY CARE PHYSICIAN:  Dr. Tenny Craw.   REASON FOR CONSULTATION:  Severe 3-vessel coronary artery disease, mild  reduction in left ventricular function, class III exertional angina.   CHIEF COMPLAINT:  Chest discomfort.   HISTORY OF PRESENT ILLNESS:  I was asked to evaluate this 65 year old  Caucasian male nonsmoker for possible multivessel coronary bypass  grafting following the diagnosis today of severe 3-vessel coronary  artery disease.  Over the past month the patient has had exertional  chest tightness relieved by rest.  He recently presented to the  emergency department with chest pain and negative enzymes.  His primary  care physician, Dr. Tenny Craw, then referred him to Dr. Patty Sermons, who  scheduled the patient for admission and subsequent diagnostic cath.  This was performed by Dr. Peter Swaziland which shows severe multivessel  disease with a 90% stenosis of the LAD, 90% stenosis of the OM II, 100%  stenosis of the OM III, and 99% stenosis of the distal RCA.  There is  inferior wall hypokinesia with EF of 45%.  Based on the patient's  symptoms and coronary anatomy by cath he was felt to be a candidate for  urgent surgical coronary revascularization, which will be scheduled in  the morning.   PAST MEDICAL HISTORY:  1. 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      Serial PET scans since then have been negative for  evidence of      recurrence.  2. Hypothyroidism recently started on Synthroid 0.112 mg daily.  3. No known drug allergies.  4. Status post bilateral hernia repair in 2008 by Dr. Ezzard Standing   MEDICATIONS:  Synthroid, Naprosyn, Cialis.   SOCIAL HISTORY:  The patient works at [employer] and does not use  alcohol and has not smoked.  He is married with twin sons, 1 of whom is  being married in mid August.  He also works as a Public house manager.   FAMILY HISTORY:  Strongly positive for coronary artery disease.  His  father died at age 69 of an MI.  Two uncles had MIs.  Mother has had  ovarian cancer.   REVIEW OF SYSTEMS:  CONSTITUTIONAL:  Negative for fever, weight loss.  ENT:  Review is significant for his prior surgery for tonsillar squamous  cell carcinoma.  He had problems with swallowing and nausea following  this and required a PEG tube, but now swallowing is much better.  He,  however, has  a chronically dry mouth and drinks extra water for the  discomfort.  He denies any active dental problems.  THORACIC: review is  negative for history of chest trauma or abnormal chest x-ray or  hemoptysis.  CARDIAC:  Review is positive coronary disease but negative  for murmur or arrhythmia.  He has probably had a silent DMI in the past.  GI:  Review is negative for hepatitis, jaundice or blood per rectum.  UROLOGIC:  Review is negative for BPH, kidney stones, or hematuria.  ENDOCRINE: Review is positive for thyroid disease,  negative for  diabetes.  VASCULAR:  Review is negative DVT, claudication, or TIA.  MUSCULOSKELETAL:  Review is positive for a fractured wrist on the left  requiring surgery by Dr. Eulah Pont. NEUROLOGIC:  Review is negative for  stroke or seizure.   His carotid Doppler studies are pending.  His cholesterol is only mildly  elevated at 185, but the triglycerides are 195.   PHYSICAL EXAMINATION:  VITAL SIGNS:  The patient is 6 feet 3, weighs 223  pounds. Blood pressure  is 120/80, pulse 70, respiratory rate 18,  temperature 98.1, saturation on room air 93%.  GENERAL:  He is a middle-aged Caucasian male in no acute distress, still  at bedrest following cardiac cath and his wife is in the room during the  interview and exam.  HEENT:  Exam is normocephalic.  Dentition good.  NECK:  Without adenopathy or mass.  There is no JVD, no carotid bruit.  LYMPHATICS:  No palpable adenopathy of the neck or supraclavicular  fossa.  RESPIRATORY:  Breath sounds are clear and equal.  There is no thoracic  deformity.  CARDIAC:  Regular rhythm without S3 gallop or murmur.  ABDOMEN:  Soft, nontender without pulsatile mass.  EXTREMITIES:  No clubbing, cyanosis or edema.  Peripheral pulses are 2+  in all extremities.  SKIN:  There is no skin rash or lesion.  NEUROLOGIC:  He is alert and oriented without focal motor deficit.   LABORATORY DATA:  I reviewed the 2-D echo and his left heart cath.  He  has inferior wall hypokinesia with EF of 45% and severe multivessel  coronary disease.   PLAN:  The patient will be prepared for surgical coronary  revascularization in the morning.  I have discussed the indications,  benefits, risks and alternatives of surgery with the patient and his  wife.  He understands the palliative nature of surgical  revascularization.  He understands the risks of bleeding, stroke, blood  transfusion requirement, MI, and death.  He agrees to proceed with  surgery in the a.m.      Kerin Perna, M.D.  Electronically Signed     PV/MEDQ  D:  06/05/2009  T:  06/05/2009  Job:  [id number]   cc:   Cassell Clement, M.D.  Vianne Bulls, M.D.

## 2011-04-06 NOTE — Op Note (Signed)
NAME:  [name], [name] NO.:  [id number]   MEDICAL RECORD NO.:  [id number]          PATIENT TYPE:  AMB   LOCATION:  DAY                          FACILITY:  [facility]   PHYSICIAN:  Sandria Bales. Ezzard Standing, M.D.  DATE OF BIRTH:  07-26-1946   DATE OF PROCEDURE:  09/21/2007  DATE OF DISCHARGE:                               OPERATIVE REPORT   PREOPERATIVE DIAGNOSIS:  Bilateral inguinal herniae, right being larger  than left.   POSTOPERATIVE DIAGNOSIS:  Medium size indirect right inguinal hernia,  medium size direct/small indirect left inguinal hernia.   OPERATION:  Bilateral laparoscopic inguinal hernia repair with pre-cut  Atrium mesh.   SURGEON:  Sandria Bales. Ezzard Standing, M.D.   ANESTHESIA:  General endotracheal.   ESTIMATED BLOOD LOSS:  Minimal.   INDICATIONS FOR PROCEDURE:  Mr. Climer is a 65 year old white male  patient of Dr. Louanna Raw who presented with bilateral inguinal  herniae.  On physical exam, his right inguinal hernia is larger than his  left one.  He has also had head and neck cancer treated by Dr. Allegra Grana, Dr. Margaretmary Bayley, and Dr. Arlan Organ, and has done well from  this and is disease free at this time.   The indication and potential complications of the surgery were explained  to the patient.  The potential complications include but are not limited  to infection, bleeding, nerve injury, bladder injury, and recurrence of  the hernia.   OPERATIVE NOTE:  The patient was placed in a supine position and given a  general anesthetic.  A Foley catheter was placed.  He was given 1 gram  Ancef at the beginning of the procedure.  His lower abdomen was prepped  with Betadine solution and sterilely draped.  A time out was held  indetifying patient and procedure.   I went through an infraumbilical incision and because his right inguinal  hernia was larger, I went through the right anterior rectus fascia,  retracted the right rectus muscle anteriorly, and used  the [place] surgical  preperitoneal balloon to insufflate the preperitoneal space.  I  visualized the rectus muscles anteriorly and the peritoneum posteriorly,  and dissected down to the pubic bone.  I identified the inferior  epigastric vessels on the anterior abdominal wall.  He had a medium size  indirect inguinal hernia on the right side.  I could see no direct  inguinal hernia component.   On the left side, he did have a direct hernia which was to the lateral  inguinal floor but I teased the preperitoneal fat out without any  difficulty.  I also found an indirect inguinal hernia on the left side,  I was able to tease the sac down.  I did ligate the each indirect sac  with a 0 PDS suture using endoloop.   I was then ready for inguinal floor repair.  I used the precut Atrium  mesh and laid this in and around the cords.  I stapled it medial to the  pubic tubercle, inferior to Cooper's ligament, superiorly to  transversalis fascia, I avoided staples lateral  to the blood vessels and  inferior to the ilioinguinal tract.  The mesh lay flat, recreated the  internal ring, and covered the indirect hernia.  On the right side, I  did the same, lined the precut Atrium mesh, again, tacked this to the  pubic tubercle medially, Coopers ligament inferiorly, I wrapped it  around the cord, and superiorly to the transversalis fascia, avoiding  the area inferior to the ilioinguinal ligament and lateral to the iliac  vessels.   The mesh lay flat on both sides, covered the herniae defects well, I  recreated the internal ring, he had no evidence of bleeding.  I  desufflated the hernia just to get a view of what it looked like, and it  looked like they both did well.  I then removed the trocars in turn.  I  closed the rectus fascia anteriorly with interrupted 0 Vicryl suture, I  closed the skin with a 5-0 subcuticular Vicryl suture.   I painted put Dermabond on the wounds.  The patient tolerated the   procedure well and was transferred to the recovery room in good  condition.  Sponge and needle counts were correct at the end of the  case.      Sandria Bales. Ezzard Standing, M.D.  Electronically Signed     DHN/MEDQ  D:  09/21/2007  T:  09/21/2007  Job:  [id number]   cc:   Louanna Raw  Fax: [phone]   Jefry H. Pollyann Kratt, MD  Fax: [phone]   Artist Pais. Kathrynn Running, M.D.  Fax: [phone]   Rose Phi. Myna Hidalgo, M.D.  Fax: [phone]

## 2011-04-06 NOTE — Assessment & Plan Note (Signed)
OFFICE VISIT   [name], [name]  DOB:  29-Aug-1946                                        August 15, 2009  CHART #:  [id number]   CURRENT PROBLEMS:  1. Status post coronary artery bypass grafting x5 in July 2010 for      severe multivessel coronary disease with unstable angina.  2. Transient postoperative atrial fibrillation, resolved.  3. Left bundle branch block.   CURRENT MEDICATIONS:  Synthroid 0.125 mg, aspirin 81 mg, Lopressor 50 mg  b.i.d., Crestor 10 mg daily.   PRESENT ILLNESS:  The patient returns for his final postop office visit,  now over 2 months following surgery.  The dizziness and syncopal  episodes that he was having postoperatively have now completely resolved  and he is tolerating rehab and regular 20-30 minute of daily walks.  His  heart rhythm event monitor that he wore for 1 month showed no adverse  problems by his report.  His incisions are healed and he has no symptoms  of angina or CHF.   PHYSICAL EXAMINATION:  Vital Signs:  Blood pressure 126/78, pulse 90,  respirations 18, saturation 98%.  General:  He is alert and oriented.  Lungs:  Breath sounds are clear and equal.  Chest:  The sternum is  stable and well healed.  Cardiac:  Rhythm is regular without S3 gallop,  rub or murmur.  Extremities:  Nontender and his right leg vein harvest  incision has healed.  There is no peripheral edema.   PLAN:  The patient will continue to progress with his activities to  include golf later this fall and he should be cleared to resume playing  soccer next spring.  He knows not to lift more than 20  pounds until 3 months after surgery and he will continue his current  medications.  He will return here for any further problems of the  surgical incisions or issues with the operation.   Kerin Perna, M.D.  Electronically Signed   PV/MEDQ  D:  08/15/2009  T:  08/16/2009  Job:  [id number]   cc:   C. Duane Lope, M.D.

## 2011-04-06 NOTE — Op Note (Signed)
NAME:  [name], BILL NO.:  [id number]   MEDICAL RECORD NO.:  [id number]          PATIENT TYPE:  INP   LOCATION:  2314                         FACILITY:  [facility]   PHYSICIAN:  Guadalupe Maple, M.D.  DATE OF BIRTH:  May 21, 1946   DATE OF PROCEDURE:  06/06/2009  DATE OF DISCHARGE:                               OPERATIVE REPORT   PROCEDURE:  Intraoperative transesophageal echocardiography.   Mr. [name] is a 65 year old white male with a history of  progressive angina and severe coronary artery disease, not amenable to  angioplasty who is now scheduled to undergo coronary artery bypass  grafting.   The intraoperative transesophageal echocardiography was indicated to  evaluate the left ventricular function to serve as a monitor for  intraoperative volume status and assess for any valvular pathology.   The patient was brought to the operating room of [facility],  and general anesthesia was induced without difficulty.  The trachea was  intubated without difficulty.  The transesophageal echocardiography  probe was then inserted into the esophagus without difficulty.   IMPRESSION:  Prebypass findings:  1. Aortic valve.  The aortic valve appeared normal.  The leaflets were      thin and pliable, opened normally and there was no aortic      insufficiency.  There was no calcification of the leaflets noted.  2. Mitral valve.  The mitral leaflets appeared to open normally and      coapt well with trace mitral insufficiency.  There were no      prolapsing segments or torn chordae appreciated.  3. Left ventricle.  The left ventricular cavity appeared to be within      normal limits of size.  There was some mild-to-moderate hypokinesis      of the inferior basilar area, but there was good contractility in      all other segments and ejection fraction was estimated at 50-55%.      There was no thrombus noted in the left ventricular apex.  4. Right ventricle.  The  right ventricular size appeared normal with      good contractility of the right ventricular free wall.  5. Tricuspid tricuspid valve.  The tricuspid valve appeared      structurally normal.  There was trace to 1+ tricuspid      insufficiency.  6. Pulmonic valve.  The pulmonic valve was visualized and there was      trace pulmonic insufficiency, but the leaflets appeared to opened      well.  7. Interatrial septum.  The interatrial septum was intact without      evidence of patent foramen ovale or atrial septal defect by either      color Doppler or bubble study.  8. Left atrium.  There was no thrombus noted in the left atrium or      left atrial appendage.  9. Ascending aorta.  The ascending aorta showed a well-defined      sinotubular ridge with no significant atheromatous disease      appreciated.  10.Descending aorta.  There was mild  atheromatous disease in the      descending aorta and the diameter was 3.0 cm.   Postbypass findings:  1. Aortic valve.  The aortic valve appeared normal, was unchanged from      the prebypass study.  2. Mitral valve.  The mitral leaflets opened normally.  There was      trace mitral insufficiency which is unchanged from the prebypass      study.  3. Left ventricle.  There was again mild hypokinesis of the inferior      basilar wall, but good contractility in other segments.  Ejection      fraction was estimated at 55%.  4. Right ventricle.  The right ventricular function appeared to be      normal.           ______________________________  Guadalupe Maple, M.D.     DCJ/MEDQ  D:  06/06/2009  T:  06/07/2009  Job:  [id number]

## 2011-04-06 NOTE — H&P (Signed)
NAME:  [name], [name] NO.:  [id number]   MEDICAL RECORD NO.:  [id number]          PATIENT TYPE:  INP   LOCATION:  1825                         FACILITY:  [facility]   PHYSICIAN:  Wendi Snipes, MD DATE OF BIRTH:  10-31-1946   DATE OF ADMISSION:  06/15/2009  DATE OF DISCHARGE:                              HISTORY & PHYSICAL   TIME:  8:30 p.m.   CARDIOLOGIST/PRIMARY CARE DOCTOR:  Dr. Patty Sermons.   This will be an admission to the cardiology service.   CHIEF COMPLAINT:  Syncope.   HISTORY OF PRESENT ILLNESS:  This is a 65 year old white male with a  history of coronary artery disease, status post coronary artery bypass  graft recently and was recently discharged from a cardiology service  after an evaluation for syncope, who presents with another episode of  syncope.  He states that he was home for a few hours and resting  comfortably when he noticed a syncopal episode after walking down a  flight of stairs.  He states that he was on the phone with a friend, who  was wishing him well, when he noticed a very brief moment of  lightheadedness.  His wife states that he fell to the floor with his  eyes rolling back in his head.  The patient states that he was out for a  few seconds and recovered without any sequelae and denies associated  chest pain, palpitations, nausea or postictal symptoms.   PAST MEDICAL HISTORY:  1. Coronary artery disease status post coronary artery bypass graft      June 06, 2009.  He received a LIMA to the LAD, saphenous vein graft      to the ramus intermedius, saphenous vein graft to the PDA, and      sequential saphenous vein graft to the OM 2 and OM 3.  2. Hypothyroidism.  3. History of squamous cell carcinoma, status post resection,      chemotherapy, and radiation  4. Hyperlipidemia.   ALLERGIES:  NO KNOWN DRUG ALLERGIES.   MEDICATIONS ON ADMISSION:  1. Crestor 10 mg daily.  2. Lasix 40 mg daily.  3. Potassium chloride 20 mEq  daily.  4. Synthroid 112 mcg daily.  5. Metoprolol 50 mg twice daily.  6. Aspirin 325 mg daily.   SOCIAL HISTORY:  Lives in [place] with his wife.  He currently works  at [employer].  He does not smoke.   REVIEW OF SYSTEMS:  All 14 systems were reviewed were negative except  those mentioned in detail in the HPI.   FAMILY HISTORY:  Significant for early coronary disease.   PHYSICAL EXAMINATION:  Blood pressure is 147/90, respiratory rate is 19,  pulse 84 beats per minute and regular, satting 99% on room air.  GENERAL:  He is a 65 year old white male appearing stated age in no  acute distress.  HEENT: Moist mucous membranes.  Pupils equal, round, react to light  accommodation.  He has a large ecchymosis on his left periorbital area  and forehead.  NECK:  No jugular venous distention.  No thyromegaly.  CARDIOVASCULAR:  Regular rate and rhythm.  No murmurs, rubs or gallops.  LUNGS: Clear to auscultation bilaterally.  ABDOMEN:  Nontender, nondistended.  Positive bowel sounds.  No masses.  EXTREMITIES: No clubbing, cyanosis or edema.  NEUROLOGIC:  Alert and x3.  Cranial nerves II-XII grossly intact.  No  focal neurologic deficit.  SKIN:  Warm, dry and intact.  No rashes.  Ecchymosis, as mentioned  above.  PSYCH:  Mood and affect are appropriate.   RADIOLOGY:  Chest X-Ray is pending.   EKG reviewed.  It was normal sinus rhythm at a rate of 76 beats per  minute with a left bundle branch block, unchanged from previous tracing.   LABORATORY REVIEW:  White blood cell count is 8.5, hematocrit is 34.7,  platelets of 370.  Potassium is 4.1.  His creatinine is 1.04, blood  sugar is 108.  Troponin is less than 0.05.  CK-MB is less than 1.0.   ASSESSMENT/PLAN:  This is a 66 year old white male with a history of  coronary disease and recent coronary artery bypass graft here with  repetitive syncopal episodes.  Syncope.  This is a possible arrhythmic etiology, although there is no   evidence on telemetry and last admission besides frequent PVCs.  There  is currently no evidence of ischemia.  The patient and his wife are very  anxious about recurrent syncopal episodes, especially as an outpatient.  Consider an EP study prior to [facility].  Otherwise, we will admit to  telemetry unit and monitor carefully.  We will additionally rule out  myocardial infarction..  Additionally, consider a tilt table test in  case this is a vasovagal in etiology, although story does not sound  consistent with this.      Wendi Snipes, MD  Electronically Signed     BHH/MEDQ  D:  06/15/2009  T:  06/15/2009  Job:  [id number]

## 2011-04-06 NOTE — Cardiovascular Report (Signed)
NAME:  [name], [name] NO.:  [id number]   MEDICAL RECORD NO.:  [id number]          PATIENT TYPE:  INP   LOCATION:  2030                         FACILITY:  [facility]   PHYSICIAN:  Peter M. Swaziland, M.D.  DATE OF BIRTH:  11-10-1946   DATE OF PROCEDURE:  06/05/2009  DATE OF DISCHARGE:                            CARDIAC CATHETERIZATION   INDICATIONS FOR PROCEDURE:  A 65 year old while white male history of  hypertension and hypercholesterolemia presents with symptoms of  crescendo angina.  He has mildly abnormal cardiac enzymes.   PROCEDURE:  Left heart catheterization, coronary and left ventricular  angiography.   EQUIPMENT USED:  6-French 4-cm right and left Judkins catheter, 6-French  pigtail catheter, and 6-French arterial sheath.   MEDICATIONS:  Local anesthesia, 1% Xylocaine.   CONTRAST:  130 mL of Omnipaque.   HEMODYNAMIC DATA:  Aortic pressure is 95/62 with a mean of 77 mmHg.  Left ventricle pressure is 192 with EDP of 11 mmHg.   COMPLICATIONS:  The patient did have frequent PVCs during procedure.  He  appeared to develop a left bundle-branch block at the end of the  procedure.  He was pain free throughout.   ANGIOGRAPHIC FINDINGS:  Left coronary arises and distributes normally.  The left main coronary is large and is normal.   The left anterior descending artery has a focal 90% stenosis in the  proximal vessel.  There is a 70% stenosis involving the origin of the  proximal first diagonal branch.   The left circumflex coronary gives rise to 3 marginal branches.  Between  the first and second obtuse marginal vessel, the circumflex is diffusely  narrowed to 80%.  There is a 90% stenosis involving the origin of the  second obtuse marginal branch.  The third obtuse marginal branch is  occluded proximally and fills by left-to-left collaterals.  This is a  moderately large vessel.   The right coronary artery is a dominant vessel.  It has diffuse 60%  narrowing in the mid vessel followed by 99% stenosis.  There is TIMI  grade I flow down the right coronary artery.  The distal right coronary  fills by left-to-right collaterals.   The left ventricular angiography was performed in the RAO view.  This  demonstrates normal left ventricular size.  There is hypokinesia  involving the basal to mid inferior wall.  Overall left ventricular  systolic function is mildly reduced with ejection fraction estimated at  45%.  There is no significant mitral insufficiency.   FINAL INTERPRETATION:  1. Severe three-vessel and complex coronary artery disease.  2. Mild left ventricular dysfunction.   PLAN:  We would recommend referral for coronary artery bypass surgery.           ______________________________  Peter M. Swaziland, M.D.     PMJ/MEDQ  D:  06/05/2009  T:  06/06/2009  Job:  [id number]   cc:   Cassell Clement, M.D.  Vianne Bulls, M.D.

## 2011-04-06 NOTE — H&P (Signed)
NAME:  Gerald, Arroyo NO.:  [id number]   MEDICAL RECORD NO.:  [id number]          PATIENT TYPE:  INP   LOCATION:  2030                         FACILITY:  [facility]   PHYSICIAN:  Cassell Clement, M.D. DATE OF BIRTH:  03-Nov-1946   DATE OF ADMISSION:  05/25/2009  DATE OF DISCHARGE:                              HISTORY & PHYSICAL   CHIEF COMPLAINT:  Chest tightness.   HISTORY:  This is a 65 year old gentleman who was referred into the  emergency room by Dr. Judithann Sauger after being seen in Dr. Tenny Craw' office  earlier today.  The patient was sent to the ER because of a history of  chest tightness.  Over the past month, the patient notes that he gets  left-sided chest tightness when he walks upstairs and he is to stop and  rest and it goes away in about 2 minutes.  He is also had some tingling  in his left arm.  His last episode was today while climbing stairs.  He  has had no symptoms at rest.  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  The patient has not  had any diaphoresis, nausea, or vomiting.  He has had a history of  borderline systolic hypertension when he goes to give blood, but he is  not on any blood pressure medication.  He does not know of any history  of hyperlipidemia or diabetes.  He is a nonsmoker.  His  electrocardiogram done in the emergency room tonight shows slight T-wave  inversion in lead III, which is new since previous EKG of 2008.   His family history reveals his father died of a heart attack at age 90  and had had earlier heart attacks since age 50.  The patient also has  two paternal uncles who died of heart attacks.  The patient's mother  died of ovarian cancer.  The patient has two healthy siblings.   SOCIAL HISTORY:  Reveals that he works at [employer].  The patient  has never smoked and he does not drink any alcohol.   He is married.  He  has two twin sons, which are 38 years old.   ALLERGIES:  The patient has no known drug allergies.   PAST SURGICAL HISTORY:  Previous surgery includes throat cancer in 2005  for which he had surgery followed by radiation and chemotherapy.  Dr.  Pollyann Holzer was his Ears, Nose, and Throat surgeon, Dr. Myna Hidalgo his  oncologist, and Dr. Kathrynn Running was his radiotherapist.  He developed  difficulty postop, so they had to be fed with a feeding tube for 6  months, but then recovered and now eats normally.  Other surgery  includes broken wrist treated by Dr. Eulah Pont and herniorrhaphy in 2008 by  Dr. Ovidio Kin.   The patient's medications are Synthroid 112 mcg daily, naproxen 500 mg  occasionally, and Cialis 20 mg occasionally.  The patient is not on any  aspirin on a regular basis.  He has no known drug allergies.   REVIEW OF SYSTEMS:  Reveals that he had a recent upper respiratory  infection with a nonspecific cough.  He has had no gastrointestinal or  genitourinary symptoms.  All other systems negative in detail.   PHYSICAL EXAMINATION:  VITAL SIGNS:  Blood pressure 134/83, pulse is 80  in normal sinus rhythm with occasional PVC.  HEAD AND NECK:  Pupils equal and reactive.  Sclerae clear.  Jugular  venous pressure normal.  Carotids, no bruits.  Thyroid not enlarged or  tender.  No lymphadenopathy.  CHEST:  Clear to percussion and auscultation.  HEART:  A quiet precordium without murmur, gallop, rub, or click.  ABDOMEN:  Soft and nontender.  EXTREMITIES:  No phlebitis or edema.  He has good peripheral pulses.   His electrocardiogram shows new T-wave inversion in III, but otherwise  no acute changes.   IMPRESSION:  1. Probable new-onset angina pectoris, rule out myocardial infarction.  2. History of mild systolic hypertension.  3. Strong family history of coronary artery disease.  4. Hypothyroidism.  5. Status post throat cancer with no evidence of recurrence.    DISPOSITION:  We are admitting to telemetry.  We will get serial  enzymes.  We will treat with aspirin, beta-blockers, and IV heparin.  We  will use nitroglycerin p.r.n. if he has any recurrent pain.  We will  check lipid status.  I anticipate cardiac catheterization on June 05, 2009, by [facility].           ______________________________  Cassell Clement, M.D.     TB/MEDQ  D:  06/04/2009  T:  06/05/2009  Job:  [id number]   cc:   C. Duane Lope, M.D.

## 2011-04-16 ENCOUNTER — Other Ambulatory Visit: Payer: Self-pay | Admitting: Cardiology

## 2011-04-16 NOTE — Telephone Encounter (Signed)
Pt called said he wants to up dosage of metoprolol from 50 mg to 100mg  please call

## 2011-04-21 MED ORDER — METOPROLOL TARTRATE 100 MG PO TABS: ORAL_TABLET | ORAL | Status: DC

## 2011-04-21 NOTE — Telephone Encounter (Signed)
Pt wanting to increase dose of metoprolol to 100mg  tab and take half tab daily. Pt is requesting this due to the price of the med. Informed him he would be getting 45 tabs to cut in half to make a 90 day supply. Pt verbalizes understanding

## 2011-05-06 ENCOUNTER — Encounter: Payer: Self-pay | Admitting: Cardiology

## 2011-05-31 ENCOUNTER — Telehealth: Payer: Self-pay | Admitting: Cardiology

## 2011-05-31 MED ORDER — METOPROLOL TARTRATE 100 MG PO TABS: ORAL_TABLET | ORAL | Status: DC

## 2011-05-31 NOTE — Telephone Encounter (Signed)
Patient phoned.  When lopressor changed from 50 mg twice daily to the 100 mg to save money, sent to [facility] at 1/2 once daily.  Should have been sent in at 1/2 twice daily.  Sent to [facility] to pay out of pocket and not go on insurance to save money

## 2011-05-31 NOTE — Telephone Encounter (Signed)
Pt needs to speak with nurse concerning his metoprolol. Pt thinks he may be taking it wrong.

## 2011-07-12 ENCOUNTER — Other Ambulatory Visit: Payer: Self-pay | Admitting: Obstetrics and Gynecology

## 2011-07-12 DIAGNOSIS — I2510 Atherosclerotic heart disease of native coronary artery without angina pectoris: Secondary | ICD-10-CM

## 2011-07-12 DIAGNOSIS — E039 Hypothyroidism, unspecified: Secondary | ICD-10-CM

## 2011-07-12 DIAGNOSIS — N529 Male erectile dysfunction, unspecified: Secondary | ICD-10-CM

## 2011-07-12 DIAGNOSIS — I447 Left bundle-branch block, unspecified: Secondary | ICD-10-CM

## 2011-07-13 ENCOUNTER — Encounter: Payer: Self-pay | Admitting: Nurse Practitioner

## 2011-07-13 ENCOUNTER — Other Ambulatory Visit (INDEPENDENT_AMBULATORY_CARE_PROVIDER_SITE_OTHER): Payer: 59 | Admitting: *Deleted

## 2011-07-13 ENCOUNTER — Ambulatory Visit (INDEPENDENT_AMBULATORY_CARE_PROVIDER_SITE_OTHER): Payer: 59 | Admitting: Nurse Practitioner

## 2011-07-13 DIAGNOSIS — I447 Left bundle-branch block, unspecified: Secondary | ICD-10-CM

## 2011-07-13 DIAGNOSIS — N529 Male erectile dysfunction, unspecified: Secondary | ICD-10-CM

## 2011-07-13 DIAGNOSIS — R030 Elevated blood-pressure reading, without diagnosis of hypertension: Secondary | ICD-10-CM

## 2011-07-13 DIAGNOSIS — E039 Hypothyroidism, unspecified: Secondary | ICD-10-CM

## 2011-07-13 DIAGNOSIS — I2510 Atherosclerotic heart disease of native coronary artery without angina pectoris: Secondary | ICD-10-CM

## 2011-07-13 DIAGNOSIS — E785 Hyperlipidemia, unspecified: Secondary | ICD-10-CM

## 2011-07-13 DIAGNOSIS — R55 Syncope and collapse: Secondary | ICD-10-CM

## 2011-07-13 LAB — BASIC METABOLIC PANEL
BUN: 22 mg/dL (ref 6–23)
CO2: 28 mEq/L (ref 19–32)
Glucose, Bld: 111 mg/dL — ABNORMAL HIGH (ref 70–99)
Potassium: 4.3 mEq/L (ref 3.5–5.1)
Sodium: 139 mEq/L (ref 135–145)

## 2011-07-13 LAB — HEPATIC FUNCTION PANEL
AST: 21 U/L (ref 0–37)
Albumin: 4.1 g/dL (ref 3.5–5.2)
Alkaline Phosphatase: 54 U/L (ref 39–117)
Total Protein: 7.2 g/dL (ref 6.0–8.3)

## 2011-07-13 LAB — LIPID PANEL
Cholesterol: 135 mg/dL (ref 0–200)
HDL: 53.3 mg/dL (ref >39.00)

## 2011-07-13 NOTE — Assessment & Plan Note (Signed)
EKG today shows sinus with LBBB.

## 2011-07-13 NOTE — Assessment & Plan Note (Signed)
Not recurrent.

## 2011-07-13 NOTE — Assessment & Plan Note (Signed)
Has had prior CABG 2 years ago. Had negative stress test last year. He is doing well. Check labs today. Will see him back in 4 months. Patient is agreeable to this plan and will call if any problems develop in the interim.

## 2011-07-13 NOTE — Patient Instructions (Addendum)
I think you are doing well from your heart standpoint We will see you back in 4 months We will call you with your labs Check some blood pressures at home Call for any problems.

## 2011-07-13 NOTE — Progress Notes (Signed)
    [name] Date of Birth: 1946/07/01   History of Present Illness: [name] is seen back today for his 4 month check. He is seen for Dr. Patty Sermons. He is doing well. No chest pain or shortness of breath. No recurrent syncope. He has chronic LBBB. He did fall trying to pull a cover off his hot tub about 6 weeks ago and has some residual soreness of the right lateral chest. No cough or DOE reported. He is tolerating his medicines.   Current Outpatient Prescriptions on File Prior to Visit  Medication Sig Dispense Refill  . acetaminophen (TYLENOL) 325 MG tablet Take 650 mg by mouth as needed.        Marland Kitchen aspirin 81 MG tablet Take 81 mg by mouth daily.        Marland Kitchen levothyroxine (SYNTHROID, LEVOTHROID) 175 MCG tablet Take 175 mcg by mouth daily.        . metoprolol (LOPRESSOR) 100 MG tablet Take 1/2 tab twice daily  180 tablet  1  . naproxen (NAPROXEN DR) 500 MG EC tablet Take 500 mg by mouth as needed.        . nitroGLYCERIN (NITROSTAT) 0.4 MG SL tablet Place 0.4 mg under the tongue every 5 (five) minutes as needed.        . rosuvastatin (CRESTOR) 20 MG tablet Take 20 mg by mouth daily.        . sildenafil (VIAGRA) 100 MG tablet Take 100 mg by mouth as needed.          No Known Allergies  Past Medical History  Diagnosis Date  . CAD (coronary artery disease)   . LBBB (left bundle branch block)   . Syncope     Past Surgical History  Procedure Date  . Coronary artery bypass graft 06/06/09  . Cardiac catheterization   . Throat cancer     RADIATION, CHEMO  . Incisional hernia repair     History  Smoking status  . Never Smoker   Smokeless tobacco  . Not on file    History  Alcohol Use: Not on file    Family History  Problem Relation Age of Onset  . Heart attack Father   . Ovarian cancer Mother     Review of Systems: The review of systems is positive for recent fall.  All other systems were reviewed and are negative.  Physical Exam: BP 138/96  Pulse 60  Ht 6\' 3"  (1.905 m)   Wt 226 lb (102.513 kg)  BMI 28.25 kg/m2 Patient is very pleasant and in no acute distress. Skin is warm and dry. Color is normal.  HEENT is unremarkable. Normocephalic/atraumatic. PERRL. Sclera are nonicteric. Neck is supple. No masses. No JVD. Lungs are clear. Cardiac exam shows a regular rate and rhythm. Abdomen is soft. Extremities are without edema. Gait and ROM are intact. No gross neurologic deficits noted.  LABORATORY DATA: PENDING  Assessment / Plan:

## 2011-07-13 NOTE — Assessment & Plan Note (Signed)
I have asked him to monitor as an outpatient.

## 2011-07-15 ENCOUNTER — Other Ambulatory Visit: Payer: Self-pay | Admitting: *Deleted

## 2011-07-15 ENCOUNTER — Telehealth: Payer: Self-pay | Admitting: *Deleted

## 2011-07-15 DIAGNOSIS — E079 Disorder of thyroid, unspecified: Secondary | ICD-10-CM

## 2011-07-15 NOTE — Telephone Encounter (Signed)
Message copied by Eugenia Pancoast on Thu Jul 15, 2011 10:06 AM ------      Message from: Cassell Clement      Created: Thu Jul 15, 2011  8:50 AM       Lipids are good.      LFTs nl      BS slight high watch carbs      TSH okay--stay on same synthroid      At next OV  Labs get F-T4 as well as TSH

## 2011-07-15 NOTE — Telephone Encounter (Signed)
Advised of labs 

## 2011-09-01 LAB — COMPREHENSIVE METABOLIC PANEL
ALT: 16
AST: 18
CO2: 26
Chloride: 101
Creatinine, Ser: 0.95
GFR calc Af Amer: >60
GFR calc non Af Amer: >60
Sodium: 135
Total Bilirubin: 0.8

## 2011-09-01 LAB — CBC
Hemoglobin: 14.9
MCV: 89.6
RBC: 4.75
WBC: 5.8

## 2011-09-01 LAB — DIFFERENTIAL
Basophils Absolute: 0.1
Eosinophils Absolute: 0.5
Eosinophils Relative: 8 — ABNORMAL HIGH

## 2011-10-08 ENCOUNTER — Other Ambulatory Visit: Payer: Self-pay | Admitting: Cardiology

## 2011-10-08 MED ORDER — ROSUVASTATIN CALCIUM 20 MG PO TABS: 20.0000 mg | ORAL_TABLET | Freq: Every day | ORAL | Status: DC

## 2011-10-08 NOTE — Telephone Encounter (Signed)
New Problem:  Needs refills on his rosuvastatin (CRESTOR) 20 MG tablet ZO#:[id number] with [facility]

## 2011-10-08 NOTE — Telephone Encounter (Signed)
Refilled crestor to [facility]

## 2011-11-10 ENCOUNTER — Ambulatory Visit: Payer: 59 | Admitting: Cardiology

## 2011-11-25 ENCOUNTER — Ambulatory Visit: Payer: 59 | Admitting: Cardiology

## 2011-12-07 ENCOUNTER — Other Ambulatory Visit: Payer: Self-pay | Admitting: Cardiology

## 2011-12-08 ENCOUNTER — Ambulatory Visit (INDEPENDENT_AMBULATORY_CARE_PROVIDER_SITE_OTHER): Payer: 59 | Admitting: Cardiology

## 2011-12-08 ENCOUNTER — Encounter: Payer: Self-pay | Admitting: Cardiology

## 2011-12-08 VITALS — BP 140/88 | HR 70 | Ht 75.0 in | Wt 225.0 lb

## 2011-12-08 DIAGNOSIS — E079 Disorder of thyroid, unspecified: Secondary | ICD-10-CM

## 2011-12-08 DIAGNOSIS — I447 Left bundle-branch block, unspecified: Secondary | ICD-10-CM

## 2011-12-08 DIAGNOSIS — E039 Hypothyroidism, unspecified: Secondary | ICD-10-CM

## 2011-12-08 DIAGNOSIS — E7800 Pure hypercholesterolemia, unspecified: Secondary | ICD-10-CM

## 2011-12-08 NOTE — Assessment & Plan Note (Signed)
No symptoms from his left bundle branch block.  No bradycardia or dizzy spells.

## 2011-12-08 NOTE — Progress Notes (Signed)
[name] Date of Birth:  Oct 26, 1946 [facility] [address] [place], [place]  [address] [phone]         Fax   [phone]  History of Present Illness: This pleasant 66 year old gentleman is seen for a scheduled followup office visit.  He has a history of coronary artery disease with coronary artery bypass graft surgery.  He has had a known left bundle branch block.  He's had a history of hyperlipidemia and a history of hypothyroidism.  Since last visit has been feeling well.  He said no dizziness or syncope.  He gets regular exercise.  He is having no recurrent angina. Current Outpatient Prescriptions  Medication Sig Dispense Refill  . acetaminophen (TYLENOL) 325 MG tablet Take 650 mg by mouth as needed.        Marland Kitchen aspirin 81 MG tablet Take 81 mg by mouth daily.        . metoprolol (LOPRESSOR) 100 MG tablet Take 1/2 tab twice daily  180 tablet  1  . naproxen (NAPROXEN DR) 500 MG EC tablet Take 500 mg by mouth as needed.        . nitroGLYCERIN (NITROSTAT) 0.4 MG SL tablet Place 0.4 mg under the tongue every 5 (five) minutes as needed.        . rosuvastatin (CRESTOR) 20 MG tablet Take 1 tablet (20 mg total) by mouth daily.  90 tablet  3  . sildenafil (VIAGRA) 100 MG tablet Take 100 mg by mouth as needed.        Marland Kitchen levothyroxine (SYNTHROID, LEVOTHROID) 175 MCG tablet TAKE 1 TABLET DAILY (NEW DOSE)  90 tablet  3    No Known Allergies  Patient Active Problem List  Diagnoses  . CAD (coronary artery disease)  . LBBB (left bundle branch block)  . Syncope  . Hypothyroidism  . Erectile dysfunction  . Elevated blood pressure reading    History  Smoking status  . Never Smoker   Smokeless tobacco  . Not on file    History  Alcohol Use No    Family History  Problem Relation Age of Onset  . Heart attack Father   . Ovarian cancer Mother     Review of Systems: Constitutional: no fever chills diaphoresis or fatigue or change in weight.  Head and  neck: no hearing loss, no epistaxis, no photophobia or visual disturbance. Respiratory: No cough, shortness of breath or wheezing. Cardiovascular: No chest pain peripheral edema, palpitations. Gastrointestinal: No abdominal distention, no abdominal pain, no change in bowel habits hematochezia or melena. Genitourinary: No dysuria, no frequency, no urgency, no nocturia. Musculoskeletal:No arthralgias, no back pain, no gait disturbance or myalgias. Neurological: No dizziness, no headaches, no numbness, no seizures, no syncope, no weakness, no tremors. Hematologic: No lymphadenopathy, no easy bruising. Psychiatric: No confusion, no hallucinations, no sleep disturbance.    Physical Exam: Filed Vitals:   12/08/11 1630  BP: 140/88  Pulse: 70   The general appearance reveals a well-developed well-nourished gentleman in no distress.The head and neck exam reveals pupils equal and reactive.  Extraocular movements are full.  There is no scleral icterus.  The mouth and pharynx are normal.  The neck is supple.  The carotids reveal no bruits.  The jugular venous pressure is normal.  The  thyroid is not enlarged.  There is no lymphadenopathy.  The chest is clear to percussion and auscultation.  There are no rales or rhonchi.  Expansion of the chest is symmetrical.  The precordium  is quiet.  The first heart sound is normal.  The second heart sound is physiologically split.  There is no murmur gallop rub or click.  There is no abnormal lift or heave.  The abdomen is soft and nontender.  The bowel sounds are normal.  The liver and spleen are not enlarged.  There are no abdominal masses.  There are no abdominal bruits.  Extremities reveal good pedal pulses.  There is no phlebitis or edema.  There is no cyanosis or clubbing.  Strength is normal and symmetrical in all extremities.  There is no lateralizing weakness.  There are no sensory deficits.  The skin is warm and dry.  There is no rash.    Assessment /  Plan: Continue same medication.  Recheck in 4 months for office visit EKG fasting lipid panel hepatic function panel nasal metabolic panel free T4 and TSH

## 2011-12-08 NOTE — Patient Instructions (Signed)
Your physician recommends that you continue on your current medications as directed. Please refer to the Current Medication list given to you today. Your physician wants you to follow-up in: 4 months You will receive a reminder letter in the mail two months in advance. If you don't receive a letter, please call our office to schedule the follow-up appointment.  

## 2011-12-08 NOTE — Assessment & Plan Note (Signed)
The patient is clinically euthyroid on his current therapy.  We refilled his prescription today.

## 2011-12-08 NOTE — Telephone Encounter (Signed)
Refilled levothyroxine.

## 2011-12-09 ENCOUNTER — Other Ambulatory Visit: Payer: Self-pay | Admitting: Dermatology

## 2012-01-20 ENCOUNTER — Other Ambulatory Visit: Payer: Self-pay | Admitting: *Deleted

## 2012-01-20 MED ORDER — SILDENAFIL CITRATE 100 MG PO TABS: 100.0000 mg | ORAL_TABLET | ORAL | Status: DC | PRN

## 2012-01-20 NOTE — Telephone Encounter (Signed)
Refilled viagra

## 2012-03-23 ENCOUNTER — Other Ambulatory Visit: Payer: Self-pay | Admitting: Cardiology

## 2012-04-04 ENCOUNTER — Other Ambulatory Visit: Payer: Self-pay | Admitting: *Deleted

## 2012-04-04 MED ORDER — SILDENAFIL CITRATE 100 MG PO TABS: 100.0000 mg | ORAL_TABLET | ORAL | Status: DC | PRN

## 2012-04-04 NOTE — Telephone Encounter (Signed)
Fax Received. Refill Completed. [name] (R.M.A)   

## 2012-04-20 ENCOUNTER — Ambulatory Visit (INDEPENDENT_AMBULATORY_CARE_PROVIDER_SITE_OTHER): Payer: 59 | Admitting: Cardiology

## 2012-04-20 ENCOUNTER — Encounter: Payer: Self-pay | Admitting: Cardiology

## 2012-04-20 ENCOUNTER — Other Ambulatory Visit: Payer: 59

## 2012-04-20 VITALS — BP 118/72 | HR 66 | Resp 18 | Ht 75.0 in | Wt 221.8 lb

## 2012-04-20 DIAGNOSIS — I447 Left bundle-branch block, unspecified: Secondary | ICD-10-CM

## 2012-04-20 DIAGNOSIS — I2510 Atherosclerotic heart disease of native coronary artery without angina pectoris: Secondary | ICD-10-CM

## 2012-04-20 DIAGNOSIS — E7800 Pure hypercholesterolemia, unspecified: Secondary | ICD-10-CM

## 2012-04-20 DIAGNOSIS — E039 Hypothyroidism, unspecified: Secondary | ICD-10-CM

## 2012-04-20 DIAGNOSIS — E079 Disorder of thyroid, unspecified: Secondary | ICD-10-CM

## 2012-04-20 LAB — HEPATIC FUNCTION PANEL
ALT: 28 U/L (ref 0–53)
Alkaline Phosphatase: 52 U/L (ref 39–117)
Bilirubin, Direct: 0.1 mg/dL (ref 0.0–0.3)
Total Protein: 7 g/dL (ref 6.0–8.3)

## 2012-04-20 LAB — BASIC METABOLIC PANEL
BUN: 19 mg/dL (ref 6–23)
Creatinine, Ser: 1 mg/dL (ref 0.4–1.5)
GFR: 82 mL/min (ref >60.00)

## 2012-04-20 LAB — TSH: TSH: 0.17 u[IU]/mL — ABNORMAL LOW (ref 0.35–5.50)

## 2012-04-20 LAB — LIPID PANEL: Cholesterol: 134 mg/dL (ref 0–200)

## 2012-04-20 NOTE — Assessment & Plan Note (Signed)
The patient is on Synthroid for his hypothyroidism.  [name] and we are checking TSH level today.

## 2012-04-20 NOTE — Progress Notes (Signed)
Quick Note:  Please report to patient. The recent labs are stable. Continue same medication and careful diet. Continue same thyroid. ______ 

## 2012-04-20 NOTE — Assessment & Plan Note (Signed)
The patient has not been experiencing any chest pain or shortness of breath.  No dizziness or syncope.  No palpitations. 

## 2012-04-20 NOTE — Assessment & Plan Note (Signed)
Patient has a symptomatic left bundle-branch block which is chronic.  No dizzy spells or syncope

## 2012-04-20 NOTE — Progress Notes (Signed)
[name] Date of Birth:  1946/02/02 [facility] [address] [place], [place]  [address] [phone]  Fax   [phone]  HPI: This pleasant 66 year old gentleman is seen for a four-month followup office visit.  He has a history of ischemic heart disease and is status post coronary artery bypass graft surgery.  He has a known left bundle branch block.  He has a history of hypothyroidism and hyperlipidemia.  He has been feeling well.  He gets regular exercise.  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 acetaminophen (TYLENOL) 325 MG tablet Take 650 mg by mouth as needed.        Marland Kitchen aspirin 81 MG tablet Take 81 mg by mouth daily.        Marland Kitchen levothyroxine (SYNTHROID, LEVOTHROID) 175 MCG tablet TAKE 1 TABLET DAILY (NEW DOSE)  90 tablet  3  . metoprolol (LOPRESSOR) 100 MG tablet Take 1/2 tab twice daily  180 tablet  1  . naproxen (NAPROXEN DR) 500 MG EC tablet Take 500 mg by mouth as needed.        . rosuvastatin (CRESTOR) 20 MG tablet Take 1 tablet (20 mg total) by mouth daily.  90 tablet  3  . nitroGLYCERIN (NITROSTAT) 0.4 MG SL tablet Place 0.4 mg under the tongue every 5 (five) minutes as needed.        . sildenafil (VIAGRA) 100 MG tablet Take 1 tablet (100 mg total) by mouth as needed.  10 tablet  0    No Known Allergies  Patient Active Problem List  Diagnoses  . CAD (coronary artery disease)  . LBBB (left bundle branch block)  . Syncope  . Hypothyroidism  . Erectile dysfunction  . Elevated blood pressure reading    History  Smoking status  . Never Smoker   Smokeless tobacco  . Not on file    History  Alcohol Use No    Family History  Problem Relation Age of Onset  . Heart attack Father   . Ovarian cancer Mother     Review of Systems: The patient denies any heat or cold intolerance.  No weight gain or weight loss.  The  patient denies headaches or blurry vision.  There is no cough or sputum production.  The patient denies dizziness.  There is no hematuria or hematochezia.  The patient denies any muscle aches or arthritis.  The patient denies any rash.  The patient denies frequent falling or instability.  There is no history of depression or anxiety.  All other systems were reviewed and are negative.   Physical Exam: Filed Vitals:   04/20/12 1123  BP: 118/72  Pulse: 66  Resp: 18   general appearance reveals a well-developed well-nourished gentleman in no distress.The head and neck exam reveals pupils equal and reactive.  Extraocular movements are full.  There is no scleral icterus.  The mouth and pharynx are normal.  The neck is supple.  The carotids reveal no bruits.  The jugular venous pressure is normal.  The  thyroid is not enlarged.  There is no lymphadenopathy.  The chest is clear to percussion and auscultation.  There are no rales or rhonchi.  Expansion of the chest is symmetrical.  The precordium is quiet.  The first heart sound is normal.  The second heart sound is physiologically split.  There is no murmur gallop  rub or click.  There is no abnormal lift or heave.  The abdomen is soft and nontender.  The bowel sounds are normal.  The liver and spleen are not enlarged.  There are no abdominal masses.  There are no abdominal bruits.  Extremities reveal good pedal pulses.  There is no phlebitis or edema.  There is no cyanosis or clubbing.  Strength is normal and symmetrical in all extremities.  There is no lateralizing weakness.  There are no sensory deficits.  The skin is warm and dry.  There is no rash.  EKG shows normal sinus rhythm and left bundle branch block    Assessment / Plan:  Continue same medication.  Blood work today is pending.  Recheck in 4 months for office visit and fasting lab work

## 2012-04-20 NOTE — Patient Instructions (Addendum)
Will obtain labs today and call you with the results  Your physician recommends that you continue on your current medications as directed. Please refer to the Current Medication list given to you today.   Your physician recommends that you schedule a follow-up appointment in: 4 months with fasting labs (lp/bmet/hfp/tsh)  

## 2012-05-18 ENCOUNTER — Telehealth: Payer: Self-pay | Admitting: Cardiology

## 2012-05-18 NOTE — Telephone Encounter (Signed)
Per  Dr. Patty Sermons ok to do

## 2012-05-18 NOTE — Telephone Encounter (Signed)
New msg Pt wants to talk to you about eating grapefruit while taking crestor. Please call

## 2012-06-15 ENCOUNTER — Other Ambulatory Visit: Payer: Self-pay | Admitting: *Deleted

## 2012-06-15 MED ORDER — METOPROLOL TARTRATE 100 MG PO TABS: ORAL_TABLET | ORAL | Status: DC

## 2012-06-19 ENCOUNTER — Telehealth: Payer: Self-pay | Admitting: Cardiology

## 2012-06-19 NOTE — Telephone Encounter (Signed)
New msg Pt wanted to talk to you about one of his meds. He wanted to talk to [facility]

## 2012-06-19 NOTE — Telephone Encounter (Signed)
Pt is requesting a written RX for Cialis to use with a discount coupon he received in the mail.  Pt states he will definitely stop using the Viagara he already has while trying the Cialis.  Will for to Dr. Patty Sermons for review.

## 2012-06-21 NOTE — Telephone Encounter (Signed)
Left message to call back  

## 2012-06-21 NOTE — Telephone Encounter (Signed)
Find out if the Cialis coupon is for the daily use 2.5 mg daily or for PRN usage (10 mg or 20 mg tabs)

## 2012-06-29 NOTE — Telephone Encounter (Signed)
Patient returning nurse call, he can be reached at [phone]

## 2012-06-29 NOTE — Telephone Encounter (Signed)
Patient would like Cialis 20 mg as directed.  Will discuss with  Dr. Patty Sermons in am when back in office

## 2012-06-30 MED ORDER — TADALAFIL 20 MG PO TABS: 20.0000 mg | ORAL_TABLET | Freq: Every day | ORAL | Status: DC | PRN

## 2012-06-30 NOTE — Telephone Encounter (Signed)
Called to [facility]

## 2012-06-30 NOTE — Telephone Encounter (Signed)
[place] [facility]

## 2012-06-30 NOTE — Telephone Encounter (Signed)
Left message to call back, pharmacy to send Rx?

## 2012-07-21 ENCOUNTER — Other Ambulatory Visit: Payer: Self-pay | Admitting: *Deleted

## 2012-07-21 DIAGNOSIS — E039 Hypothyroidism, unspecified: Secondary | ICD-10-CM

## 2012-07-21 DIAGNOSIS — I2510 Atherosclerotic heart disease of native coronary artery without angina pectoris: Secondary | ICD-10-CM

## 2012-07-21 DIAGNOSIS — R030 Elevated blood-pressure reading, without diagnosis of hypertension: Secondary | ICD-10-CM

## 2012-07-25 ENCOUNTER — Ambulatory Visit (INDEPENDENT_AMBULATORY_CARE_PROVIDER_SITE_OTHER): Payer: 59 | Admitting: Cardiology

## 2012-07-25 ENCOUNTER — Other Ambulatory Visit (INDEPENDENT_AMBULATORY_CARE_PROVIDER_SITE_OTHER): Payer: 59

## 2012-07-25 ENCOUNTER — Encounter: Payer: Self-pay | Admitting: Cardiology

## 2012-07-25 VITALS — BP 118/72 | HR 58 | Ht 75.0 in | Wt 220.0 lb

## 2012-07-25 DIAGNOSIS — I25810 Atherosclerosis of coronary artery bypass graft(s) without angina pectoris: Secondary | ICD-10-CM

## 2012-07-25 DIAGNOSIS — I447 Left bundle-branch block, unspecified: Secondary | ICD-10-CM

## 2012-07-25 DIAGNOSIS — I2510 Atherosclerotic heart disease of native coronary artery without angina pectoris: Secondary | ICD-10-CM

## 2012-07-25 DIAGNOSIS — E039 Hypothyroidism, unspecified: Secondary | ICD-10-CM

## 2012-07-25 DIAGNOSIS — E0590 Thyrotoxicosis, unspecified without thyrotoxic crisis or storm: Secondary | ICD-10-CM

## 2012-07-25 DIAGNOSIS — R030 Elevated blood-pressure reading, without diagnosis of hypertension: Secondary | ICD-10-CM

## 2012-07-25 DIAGNOSIS — E7800 Pure hypercholesterolemia, unspecified: Secondary | ICD-10-CM

## 2012-07-25 LAB — HEPATIC FUNCTION PANEL
AST: 19 U/L (ref 0–37)
Albumin: 3.8 g/dL (ref 3.5–5.2)
Alkaline Phosphatase: 49 U/L (ref 39–117)
Bilirubin, Direct: 0.1 mg/dL (ref 0.0–0.3)

## 2012-07-25 LAB — BASIC METABOLIC PANEL
CO2: 27 mEq/L (ref 19–32)
Calcium: 9.2 mg/dL (ref 8.4–10.5)
Creatinine, Ser: 0.9 mg/dL (ref 0.4–1.5)

## 2012-07-25 LAB — LIPID PANEL
Total CHOL/HDL Ratio: 2
Triglycerides: 80 mg/dL (ref 0.0–149.0)

## 2012-07-25 NOTE — Progress Notes (Signed)
Quick Note:  Please report to patient. The recent labs are stable. Continue same medication and careful diet. (At next visit get a FT4 as well as a TSH) ______

## 2012-07-25 NOTE — Assessment & Plan Note (Signed)
The patient has a past history of known left bundle branch block.  He has a remote history of syncope and had a previous EP study which was normal.  He has not been having any dizziness or syncope recently

## 2012-07-25 NOTE — Patient Instructions (Addendum)
Your physician recommends that you continue on your current medications as directed. Please refer to the Current Medication list given to you today.  Your physician wants you to follow-up in: 4 months with fasting labs (lp/bmet/hfp/tsh/ekg)  You will receive a reminder letter in the mail two months in advance. If you don't receive a letter, please call our office to schedule the follow-up appointment.   Will obtain labs today and call you with the results (lp/bmet/hfp/tsh)

## 2012-07-25 NOTE — Assessment & Plan Note (Signed)
The patient has had no recurrent chest pain or angina.  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  He has not been experiencing any cardiac symptoms referable to his physical exertion on the field.

## 2012-07-25 NOTE — Progress Notes (Signed)
[name] Date of Birth:  01/24/1946 [facility] [address] [place], [place]  [address] [phone]  Fax   [phone]  HPI: This pleasant 66 year old gentleman is seen for a four-month followup office visit. He has a history of ischemic heart disease and is status post coronary artery bypass graft surgery. He has a known left bundle branch block. He has a history of hypothyroidism and hyperlipidemia. He has been feeling well. He gets regular exercise. 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   Current Outpatient Prescriptions  Medication Sig Dispense Refill  . aspirin 81 MG tablet Take 81 mg by mouth daily.        Marland Kitchen levothyroxine (SYNTHROID, LEVOTHROID) 175 MCG tablet TAKE 1 TABLET DAILY (NEW DOSE)  90 tablet  3  . metoprolol (LOPRESSOR) 100 MG tablet Take 1/2 tab twice daily  180 tablet  1  . naproxen (NAPROXEN DR) 500 MG EC tablet Take 500 mg by mouth as needed.        . rosuvastatin (CRESTOR) 20 MG tablet Take 1 tablet (20 mg total) by mouth daily.  90 tablet  3  . tadalafil (CIALIS) 20 MG tablet Take 1 tablet (20 mg total) by mouth daily as needed.  6 tablet  1    No Known Allergies  Patient Active Problem List  Diagnosis  . CAD (coronary artery disease)  . LBBB (left bundle branch block)  . Syncope  . Hypothyroidism  . Erectile dysfunction  . Elevated blood pressure reading    History  Smoking status  . Never Smoker   Smokeless tobacco  . Not on file    History  Alcohol Use No    Family History  Problem Relation Age of Onset  . Heart attack Father   . Ovarian cancer Mother     Review of Systems: The patient denies any heat or cold intolerance.  No weight gain or weight loss.  The patient denies headaches or blurry vision.  There is no cough or sputum production.  The patient denies dizziness.  There is no hematuria or hematochezia.  The patient denies any muscle aches or  arthritis.  The patient denies any rash.  The patient denies frequent falling or instability.  There is no history of depression or anxiety.  All other systems were reviewed and are negative.   Physical Exam: Filed Vitals:   07/25/12 1039  BP: 118/72  Pulse: 58   the general appearance reveals a well-developed well-nourished gentleman in no distress.The head and neck exam reveals pupils equal and reactive.  Extraocular movements are full.  There is no scleral icterus.  The mouth and pharynx are normal.  The neck is supple.  The carotids reveal no bruits.  The jugular venous pressure is normal.  The  thyroid is not enlarged.  There is no lymphadenopathy.  The chest is clear to percussion and auscultation.  There are no rales or rhonchi.  Expansion of the chest is symmetrical.  The precordium is quiet.  The first heart sound is normal.  The second heart sound is physiologically split.  There is no murmur gallop rub or click.  There is no abnormal lift or heave.  The abdomen is soft and nontender.  The bowel sounds are normal.  The liver and spleen are not enlarged.  There are no abdominal masses.  There are no abdominal bruits.  Extremities reveal good pedal pulses.  There is  no phlebitis or edema.  There is no cyanosis or clubbing.  Strength is normal and symmetrical in all extremities.  There is no lateralizing weakness.  There are no sensory deficits.  The skin is warm and dry.  There is no rash.      Assessment / Plan: Continue same medication.  He also sees Dr. Richardson Landry for intermittent right knee pain requiring occasional steroid injections.  He will return here in 4 months for followup office visit EKG lipid panel hepatic function panel basal metabolic panel and TSH.

## 2012-07-25 NOTE — Assessment & Plan Note (Signed)
The patient is on Synthroid.  [name].  We're checking lab work today

## 2012-07-28 ENCOUNTER — Telehealth: Payer: Self-pay | Admitting: *Deleted

## 2012-07-28 NOTE — Telephone Encounter (Signed)
Advised patient of lab results  

## 2012-07-28 NOTE — Telephone Encounter (Signed)
Message copied by Burnell Blanks on Fri Jul 28, 2012  5:55 PM ------      Message from: Cassell Clement      Created: Tue Jul 25, 2012  9:09 PM       Please report to patient.  The recent labs are stable. Continue same medication and careful diet.  (At next visit get a FT4 as well as a TSH)

## 2012-09-05 ENCOUNTER — Telehealth: Payer: Self-pay | Admitting: Cardiology

## 2012-11-16 ENCOUNTER — Other Ambulatory Visit: Payer: Self-pay | Admitting: *Deleted

## 2012-11-17 ENCOUNTER — Other Ambulatory Visit: Payer: Self-pay

## 2012-11-17 DIAGNOSIS — N529 Male erectile dysfunction, unspecified: Secondary | ICD-10-CM

## 2012-11-19 MED ORDER — TADALAFIL 20 MG PO TABS: 20.0000 mg | ORAL_TABLET | Freq: Every day | ORAL | Status: DC | PRN

## 2012-11-20 ENCOUNTER — Other Ambulatory Visit: Payer: Self-pay | Admitting: *Deleted

## 2012-11-21 ENCOUNTER — Telehealth: Payer: Self-pay | Admitting: Cardiology

## 2012-11-21 ENCOUNTER — Other Ambulatory Visit: Payer: Self-pay

## 2012-11-21 DIAGNOSIS — N529 Male erectile dysfunction, unspecified: Secondary | ICD-10-CM

## 2012-11-21 MED ORDER — SILDENAFIL CITRATE 100 MG PO TABS: 100.0000 mg | ORAL_TABLET | Freq: Every day | ORAL | Status: DC | PRN

## 2012-11-21 MED ORDER — ROSUVASTATIN CALCIUM 20 MG PO TABS: 20.0000 mg | ORAL_TABLET | Freq: Every day | ORAL | Status: DC

## 2012-11-21 NOTE — Telephone Encounter (Signed)
plz return call to pt [phone] regarding medication issues.  RX sent to invalid pharmacy.

## 2012-11-21 NOTE — Telephone Encounter (Signed)
Pt needs refill of crestor called into [facility] 90 day supply with refills

## 2012-11-23 NOTE — Telephone Encounter (Signed)
Follow-up:     Patient called back to follow-up on his previous call form 11/21/12.  Please call back.

## 2012-11-24 ENCOUNTER — Other Ambulatory Visit: Payer: Self-pay | Admitting: Cardiology

## 2012-11-24 MED ORDER — ROSUVASTATIN CALCIUM 20 MG PO TABS: 20.0000 mg | ORAL_TABLET | Freq: Every day | ORAL | Status: DC

## 2012-12-04 ENCOUNTER — Encounter: Payer: Self-pay | Admitting: Cardiology

## 2012-12-04 ENCOUNTER — Ambulatory Visit (INDEPENDENT_AMBULATORY_CARE_PROVIDER_SITE_OTHER): Payer: 59 | Admitting: Cardiology

## 2012-12-04 VITALS — BP 120/62 | HR 73 | Resp 18 | Ht 75.0 in | Wt 221.4 lb

## 2012-12-04 DIAGNOSIS — I493 Ventricular premature depolarization: Secondary | ICD-10-CM

## 2012-12-04 DIAGNOSIS — R030 Elevated blood-pressure reading, without diagnosis of hypertension: Secondary | ICD-10-CM

## 2012-12-04 DIAGNOSIS — E039 Hypothyroidism, unspecified: Secondary | ICD-10-CM

## 2012-12-04 DIAGNOSIS — I4949 Other premature depolarization: Secondary | ICD-10-CM

## 2012-12-04 DIAGNOSIS — I259 Chronic ischemic heart disease, unspecified: Secondary | ICD-10-CM

## 2012-12-04 DIAGNOSIS — I447 Left bundle-branch block, unspecified: Secondary | ICD-10-CM

## 2012-12-04 DIAGNOSIS — I2510 Atherosclerotic heart disease of native coronary artery without angina pectoris: Secondary | ICD-10-CM

## 2012-12-04 DIAGNOSIS — E7800 Pure hypercholesterolemia, unspecified: Secondary | ICD-10-CM

## 2012-12-04 NOTE — Assessment & Plan Note (Signed)
The patient has good exercise tolerance.  He has not been experiencing any recurrent chest pain or angina.

## 2012-12-04 NOTE — Assessment & Plan Note (Signed)
The patient has chronic left bundle-branch block.  Today on EKG he has occasional PVCs which are asymptomatic.  The patient avoids caffeine

## 2012-12-04 NOTE — Patient Instructions (Addendum)
Your physician recommends that you continue on your current medications as directed. Please refer to the Current Medication list given to you today.  Your physician wants you to follow-up in: 4 months with fasting labs (lp/bmet/hfp) You will receive a reminder letter in the mail two months in advance. If you don't receive a letter, please call our office to schedule the follow-up appointment.  

## 2012-12-04 NOTE — Progress Notes (Signed)
[name] Date of Birth:  Apr 26, 1946 [facility] [address] [place], [place]  [address] [phone]         Fax   [phone]  History of Present Illness: This pleasant 67 year old gentleman is seen for a four-month followup office visit. He has a history of ischemic heart disease and is status post coronary artery bypass graft surgery. He has a known left bundle branch block.  He has had a previous EP study which was normal as part of an evaluation for syncope. He has a history of hypothyroidism and hyperlipidemia. He has been feeling well. He gets regular exercise. 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  During the off season from soccer he goes to the gym and works out.  Current Outpatient Prescriptions  Medication Sig Dispense Refill  . aspirin 81 MG tablet Take 81 mg by mouth daily.        Marland Kitchen levothyroxine (SYNTHROID, LEVOTHROID) 175 MCG tablet TAKE 1 TABLET DAILY (NEW DOSE)  90 tablet  3  . metoprolol (LOPRESSOR) 100 MG tablet Take 1/2 tab twice daily  180 tablet  1  . naproxen (NAPROXEN DR) 500 MG EC tablet Take 500 mg by mouth as needed.        . rosuvastatin (CRESTOR) 20 MG tablet Take 1 tablet (20 mg total) by mouth daily.  90 tablet  3  . sildenafil (VIAGRA) 100 MG tablet Take 1 tablet (100 mg total) by mouth daily as needed for erectile dysfunction.  10 tablet  4  . tadalafil (CIALIS) 20 MG tablet Take 1 tablet (20 mg total) by mouth daily as needed.  6 tablet  1    No Known Allergies  Patient Active Problem List  Diagnosis  . CAD (coronary artery disease)  . LBBB (left bundle branch block)  . Syncope  . Hypothyroidism  . Erectile dysfunction  . Elevated blood pressure reading    History  Smoking status  . Never Smoker   Smokeless tobacco  . Not on file    History  Alcohol Use No    Family History  Problem Relation Age of Onset  . Heart attack Father   . Ovarian cancer Mother      Review of Systems: Constitutional: no fever chills diaphoresis or fatigue or change in weight.  Head and neck: no hearing loss, no epistaxis, no photophobia or visual disturbance. Respiratory: No cough, shortness of breath or wheezing. Cardiovascular: No chest pain peripheral edema, palpitations. Gastrointestinal: No abdominal distention, no abdominal pain, no change in bowel habits hematochezia or melena. Genitourinary: No dysuria, no frequency, no urgency, no nocturia. Musculoskeletal:No arthralgias, no back pain, no gait disturbance or myalgias. Neurological: No dizziness, no headaches, no numbness, no seizures, no syncope, no weakness, no tremors. Hematologic: No lymphadenopathy, no easy bruising. Psychiatric: No confusion, no hallucinations, no sleep disturbance.    Physical Exam: Filed Vitals:   12/04/12 1401  BP: 120/62  Pulse: 73  Resp: 18   general appearance reveals a well-developed well-nourished elderly gentleman in no distress.The head and neck exam reveals pupils equal and reactive.  Extraocular movements are full.  There is no scleral icterus.  The mouth and pharynx are normal.  The neck is supple.  The carotids reveal no bruits.  The jugular venous pressure is normal.  The  thyroid is not enlarged.  There is no lymphadenopathy.  The chest is clear to percussion and auscultation.  There  are no rales or rhonchi.  Expansion of the chest is symmetrical.  The precordium is quiet.  Occasional PVCs are noted  The first heart sound is normal.  The second heart sound is physiologically split.  There is no murmur gallop rub or click.  There is no abnormal lift or heave.  The abdomen is soft and nontender.  The bowel sounds are normal.  The liver and spleen are not enlarged.  There are no abdominal masses.  There are no abdominal bruits.  Extremities reveal good pedal pulses.  There is no phlebitis or edema.  There is no cyanosis or clubbing.  Strength is normal and symmetrical in all  extremities.  There is no lateralizing weakness.  There are no sensory deficits.  The skin is warm and dry.  There is no rash.  EKG shows normal sinus rhythm with occasional PVCs.  There is left bundle branch block unchanged since 07/13/11   Assessment / Plan: Continue on same medication.  The patient did not come in fasting today.  He will return in 4 months for followup office visit lipid panel hepatic function panel basal metabolic panel TSH and free T4

## 2012-12-04 NOTE — Assessment & Plan Note (Signed)
Blood pressure has been remaining stable on current therapy.  No headaches.  No dizziness or syncope. 

## 2012-12-04 NOTE — Assessment & Plan Note (Signed)
The patient is clinically euthyroid. 

## 2012-12-14 ENCOUNTER — Other Ambulatory Visit: Payer: Self-pay | Admitting: *Deleted

## 2012-12-14 DIAGNOSIS — E039 Hypothyroidism, unspecified: Secondary | ICD-10-CM

## 2012-12-14 DIAGNOSIS — E0590 Thyrotoxicosis, unspecified without thyrotoxic crisis or storm: Secondary | ICD-10-CM

## 2012-12-14 MED ORDER — LEVOTHYROXINE SODIUM 175 MCG PO TABS: 175.0000 ug | ORAL_TABLET | Freq: Every day | ORAL | Status: DC

## 2012-12-15 ENCOUNTER — Other Ambulatory Visit: Payer: Self-pay

## 2012-12-15 DIAGNOSIS — E039 Hypothyroidism, unspecified: Secondary | ICD-10-CM

## 2012-12-15 MED ORDER — LEVOTHYROXINE SODIUM 175 MCG PO TABS: 175.0000 ug | ORAL_TABLET | Freq: Every day | ORAL | Status: DC

## 2012-12-15 MED ORDER — ROSUVASTATIN CALCIUM 20 MG PO TABS: 20.0000 mg | ORAL_TABLET | Freq: Every day | ORAL | Status: DC

## 2012-12-18 ENCOUNTER — Other Ambulatory Visit: Payer: Self-pay

## 2012-12-18 DIAGNOSIS — E039 Hypothyroidism, unspecified: Secondary | ICD-10-CM

## 2012-12-18 MED ORDER — ROSUVASTATIN CALCIUM 20 MG PO TABS: 20.0000 mg | ORAL_TABLET | Freq: Every day | ORAL | Status: DC

## 2012-12-18 MED ORDER — LEVOTHYROXINE SODIUM 175 MCG PO TABS: 175.0000 ug | ORAL_TABLET | Freq: Every day | ORAL | Status: DC

## 2013-04-04 ENCOUNTER — Encounter: Payer: Self-pay | Admitting: Cardiology

## 2013-04-04 ENCOUNTER — Other Ambulatory Visit (INDEPENDENT_AMBULATORY_CARE_PROVIDER_SITE_OTHER): Payer: 59

## 2013-04-04 ENCOUNTER — Ambulatory Visit (INDEPENDENT_AMBULATORY_CARE_PROVIDER_SITE_OTHER): Payer: 59 | Admitting: Cardiology

## 2013-04-04 VITALS — BP 110/78 | HR 66 | Ht 75.0 in | Wt 225.1 lb

## 2013-04-04 DIAGNOSIS — I2510 Atherosclerotic heart disease of native coronary artery without angina pectoris: Secondary | ICD-10-CM

## 2013-04-04 DIAGNOSIS — E039 Hypothyroidism, unspecified: Secondary | ICD-10-CM

## 2013-04-04 DIAGNOSIS — I259 Chronic ischemic heart disease, unspecified: Secondary | ICD-10-CM

## 2013-04-04 DIAGNOSIS — E7800 Pure hypercholesterolemia, unspecified: Secondary | ICD-10-CM

## 2013-04-04 DIAGNOSIS — R55 Syncope and collapse: Secondary | ICD-10-CM

## 2013-04-04 LAB — BASIC METABOLIC PANEL
CO2: 28 mEq/L (ref 19–32)
Chloride: 107 mEq/L (ref 96–112)
Potassium: 4.8 mEq/L (ref 3.5–5.1)
Sodium: 139 mEq/L (ref 135–145)

## 2013-04-04 LAB — LIPID PANEL
Cholesterol: 117 mg/dL (ref 0–200)
HDL: 43.5 mg/dL (ref >39.00)
LDL Cholesterol: 59 mg/dL (ref 0–99)
Total CHOL/HDL Ratio: 3
Triglycerides: 74 mg/dL (ref 0.0–149.0)
VLDL: 14.8 mg/dL (ref 0.0–40.0)

## 2013-04-04 LAB — T4, FREE: Free T4: 1.53 ng/dL (ref 0.60–1.60)

## 2013-04-04 LAB — TSH: TSH: 0.04 u[IU]/mL — ABNORMAL LOW (ref 0.35–5.50)

## 2013-04-04 LAB — HEPATIC FUNCTION PANEL
Albumin: 3.7 g/dL (ref 3.5–5.2)
Total Protein: 6.4 g/dL (ref 6.0–8.3)

## 2013-04-04 NOTE — Patient Instructions (Addendum)
Will obtain labs today and call you with the results (lp/bmet/hfp/tsh/freet4)  Your physician recommends that you continue on your current medications as directed. Please refer to the Current Medication list given to you today.  Your physician wants you to follow-up in: 6 months with fasting labs (lp/bmet/hfp/tsh)  You will receive a reminder letter in the mail two months in advance. If you don't receive a letter, please call our office to schedule the follow-up appointment.

## 2013-04-04 NOTE — Progress Notes (Addendum)
[name] Date of Birth:  08-05-1946 [facility] [address] [place], [place]  [address] [phone]  Fax   [phone]  HPI: This pleasant 67 year old gentleman is seen for a four-month followup office visit. He has a history of ischemic heart disease and is status post coronary artery bypass graft surgery. He has a known left bundle branch block. He has had a previous EP study which was normal as part of an evaluation for syncope. He has a history of hypothyroidism and hyperlipidemia. He has been feeling well. He gets regular exercise. 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 During the off season from soccer he goes to the gym and works out.    Current Outpatient Prescriptions  Medication Sig Dispense Refill  . aspirin 81 MG tablet Take 81 mg by mouth daily.        Marland Kitchen levothyroxine (SYNTHROID, LEVOTHROID) 175 MCG tablet Take 1 tablet (175 mcg total) by mouth daily.  90 tablet  3  . metoprolol (LOPRESSOR) 100 MG tablet Take 1/2 tab twice daily  180 tablet  1  . naproxen (NAPROXEN DR) 500 MG EC tablet Take 500 mg by mouth as needed.        . rosuvastatin (CRESTOR) 20 MG tablet Take 1 tablet (20 mg total) by mouth daily.  90 tablet  3  . sildenafil (VIAGRA) 100 MG tablet Take 1 tablet (100 mg total) by mouth daily as needed for erectile dysfunction.  10 tablet  4  . tadalafil (CIALIS) 20 MG tablet Take 1 tablet (20 mg total) by mouth daily as needed.  6 tablet  1   No current facility-administered medications for this visit.    No Known Allergies  Patient Active Problem List   Diagnosis Date Noted  . CAD (coronary artery disease)     Priority: Medium  . Elevated blood pressure reading 07/13/2011  . Hypothyroidism 02/24/2011  . Erectile dysfunction 02/24/2011  . LBBB (left bundle branch block)   . Syncope     History  Smoking status  . Never Smoker   Smokeless tobacco  . Not on file    History    Alcohol Use No    Family History  Problem Relation Age of Onset  . Heart attack Father   . Ovarian cancer Mother     Review of Systems: The patient denies any heat or cold intolerance.  No weight gain or weight loss.  The patient denies headaches or blurry vision.  There is no cough or sputum production.  The patient denies dizziness.  There is no hematuria or hematochezia.  The patient denies any muscle aches or arthritis.  The patient denies any rash.  The patient denies frequent falling or instability.  There is no history of depression or anxiety.  All other systems were reviewed and are negative.   Physical Exam: Filed Vitals:   04/04/13 1059  BP: 110/78  Pulse: 66   the general appearance reveals a well-developed well-nourished tall gentleman in no distress.The head and neck exam reveals pupils equal and reactive.  Extraocular movements are full.  There is no scleral icterus.  The mouth and pharynx are normal.  The neck is supple.  The carotids reveal no bruits.  The jugular venous pressure is normal.  The  thyroid is not enlarged.  There is no lymphadenopathy.  The chest is clear to percussion and auscultation.  There are no rales or rhonchi.  Expansion of the chest is symmetrical.  The precordium is quiet.  The first heart sound is normal.  The second heart sound is physiologically split.  There is no murmur gallop rub or click.  There is no abnormal lift or heave.  The abdomen is soft and nontender.  The bowel sounds are normal.  The liver and spleen are not enlarged.  There are no abdominal masses.  There are no abdominal bruits.  Extremities reveal good pedal pulses.  There is no phlebitis or edema.  There is no cyanosis or clubbing.  Strength is normal and symmetrical in all extremities.  There is no lateralizing weakness.  There are no sensory deficits.  The skin is warm and dry.  There is no rash.      Assessment / Plan: Continue on same medication.  Recheck in 6 months for  followup office visit EKG lipid panel hepatic function panel basal metabolic panel and TSH. He will be seeing his PCP Dr. Tenny Craw for health maintenance issues such as colonoscopy and prostate check.

## 2013-04-04 NOTE — Assessment & Plan Note (Signed)
The patient has had no further episodes of dizziness or syncope. 

## 2013-04-04 NOTE — Assessment & Plan Note (Signed)
The patient has not been having any angina pectoris or chest pains.  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

## 2013-04-04 NOTE — Progress Notes (Signed)
Quick Note:  Please report to patient. The recent labs are stable. Continue same medication and careful diet. However the thyroid hormone levels are borderline high now and a TSH level is very low. Decrease his present Synthroid 175 mcg to a lower dose of 150 mcg daily. If he has a large supply of his present tablets he can take them just 6 days a week to use them up then drop down to 150 mcg daily. ______

## 2013-04-04 NOTE — Assessment & Plan Note (Signed)
The patient is clinically euthyroid. 

## 2013-04-10 ENCOUNTER — Telehealth: Payer: Self-pay | Admitting: *Deleted

## 2013-04-10 DIAGNOSIS — E039 Hypothyroidism, unspecified: Secondary | ICD-10-CM

## 2013-04-10 MED ORDER — LEVOTHYROXINE SODIUM 150 MCG PO TABS: 150.0000 ug | ORAL_TABLET | Freq: Every day | ORAL | Status: AC

## 2013-04-10 NOTE — Telephone Encounter (Signed)
Message copied by Burnell Blanks on Tue Apr 10, 2013  5:55 PM ------      Message from: Cassell Clement      Created: Wed Apr 04, 2013  7:58 PM       Please report to patient.  The recent labs are stable. Continue same medication and careful diet.  However the thyroid hormone levels are borderline high now and a TSH level is very low.  Decrease his present Synthroid 175 mcg to a lower dose of 150 mcg daily.  If he has a large supply of his present tablets he can take them just 6 days a week to use them up then drop down to 150 mcg daily. ------

## 2013-04-10 NOTE — Telephone Encounter (Signed)
Advised patient of lab results and medication change  

## 2013-05-08 ENCOUNTER — Other Ambulatory Visit: Payer: Self-pay | Admitting: Cardiology

## 2013-10-03 ENCOUNTER — Ambulatory Visit (INDEPENDENT_AMBULATORY_CARE_PROVIDER_SITE_OTHER): Payer: 59 | Admitting: Cardiology

## 2013-10-03 ENCOUNTER — Encounter: Payer: Self-pay | Admitting: Cardiology

## 2013-10-03 VITALS — BP 130/72 | HR 61 | Ht 75.0 in | Wt 226.0 lb

## 2013-10-03 DIAGNOSIS — E039 Hypothyroidism, unspecified: Secondary | ICD-10-CM

## 2013-10-03 DIAGNOSIS — I2510 Atherosclerotic heart disease of native coronary artery without angina pectoris: Secondary | ICD-10-CM

## 2013-10-03 DIAGNOSIS — I259 Chronic ischemic heart disease, unspecified: Secondary | ICD-10-CM

## 2013-10-03 DIAGNOSIS — R55 Syncope and collapse: Secondary | ICD-10-CM

## 2013-10-03 DIAGNOSIS — E7800 Pure hypercholesterolemia, unspecified: Secondary | ICD-10-CM

## 2013-10-03 LAB — HEPATIC FUNCTION PANEL
ALT: 21 U/L (ref 0–53)
Albumin: 3.9 g/dL (ref 3.5–5.2)
Total Bilirubin: 0.9 mg/dL (ref 0.3–1.2)

## 2013-10-03 LAB — TSH: TSH: 0.22 u[IU]/mL — ABNORMAL LOW (ref 0.35–5.50)

## 2013-10-03 LAB — LIPID PANEL
HDL: 51.1 mg/dL (ref >39.00)
Triglycerides: 88 mg/dL (ref 0.0–149.0)
VLDL: 17.6 mg/dL (ref 0.0–40.0)

## 2013-10-03 LAB — BASIC METABOLIC PANEL
GFR: 75.37 mL/min (ref >60.00)
Glucose, Bld: 100 mg/dL — ABNORMAL HIGH (ref 70–99)
Potassium: 3.9 mEq/L (ref 3.5–5.1)
Sodium: 136 mEq/L (ref 135–145)

## 2013-10-03 NOTE — Patient Instructions (Signed)
Your physician recommends that you have lab work today: LP/HFP/BMET/TSH  Your physician recommends that you continue on your current medications as directed. Please refer to the Current Medication list given to you today.   Your physician wants you to follow-up in: 6 months with Dr. Patty Sermons.  You will receive a reminder letter in the mail two months in advance. If you don't receive a letter, please call our office to schedule the follow-up appointment.  Your physician recommends that you return for lab work in: 6 months at your follow up with Dr. Patty Sermons (LP/HFP/BMET)

## 2013-10-03 NOTE — Assessment & Plan Note (Signed)
The patient is not having any symptoms of angina pectoris or shortness of breath with exertion

## 2013-10-03 NOTE — Assessment & Plan Note (Signed)
The patient has not had any further episodes of dizziness or syncope 

## 2013-10-03 NOTE — Progress Notes (Signed)
Quick Note:  Please report to patient. The recent labs are stable. Continue same medication and careful diet. Continue same thyroid. ______

## 2013-10-03 NOTE — Assessment & Plan Note (Signed)
The patient is clinically euthyroid.  We are checking TSH today.

## 2013-10-03 NOTE — Progress Notes (Signed)
[name] Date of Birth:  October 26, 1946 [address] [place], [place]  [address] [phone]  Fax   [phone]  HPI: This pleasant 67 year old gentleman is seen for a six-month followup office visit. He has a history of ischemic heart disease and is status post coronary artery bypass graft surgery. He has a known left bundle branch block. He has had a previous EP study which was normal as part of an evaluation for syncope. He has a history of hypothyroidism and hyperlipidemia. He has been feeling well. He gets regular exercise. 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 During the off season from soccer he goes to the gym and works out.  He also goes downhill skiing in [place] once each winter.   Current Outpatient Prescriptions  Medication Sig Dispense Refill  . aspirin 81 MG tablet Take 81 mg by mouth daily.        Marland Kitchen levothyroxine (SYNTHROID, LEVOTHROID) 150 MCG tablet Take 1 tablet (150 mcg total) by mouth daily.  90 tablet  3  . metoprolol (LOPRESSOR) 100 MG tablet TAKE 1/2 TABLET BY MOUTH TWICE DAILY  180 tablet  0  . naproxen (NAPROXEN DR) 500 MG EC tablet Take 500 mg by mouth as needed.        . rosuvastatin (CRESTOR) 20 MG tablet Take 1 tablet (20 mg total) by mouth daily.  90 tablet  3  . tadalafil (CIALIS) 20 MG tablet Take 1 tablet (20 mg total) by mouth daily as needed.  6 tablet  1   No current facility-administered medications for this visit.    No Known Allergies  Patient Active Problem List   Diagnosis Date Noted  . CAD (coronary artery disease)     Priority: Medium  . Elevated blood pressure reading 07/13/2011  . Hypothyroidism 02/24/2011  . Erectile dysfunction 02/24/2011  . LBBB (left bundle branch block)   . Syncope     History  Smoking status  . Never Smoker   Smokeless tobacco  . Not on file    History  Alcohol Use No    Family History  Problem Relation Age of Onset  . Heart  attack Father   . Ovarian cancer Mother     Review of Systems: The patient denies any heat or cold intolerance.  No weight gain or weight loss.  The patient denies headaches or blurry vision.  There is no cough or sputum production.  The patient denies dizziness.  There is no hematuria or hematochezia.  The patient denies any muscle aches or arthritis.  The patient denies any rash.  The patient denies frequent falling or instability.  There is no history of depression or anxiety.  All other systems were reviewed and are negative.   Physical Exam: Filed Vitals:   10/03/13 1134  BP: 130/72  Pulse: 61   the general appearance reveals a well-developed well-nourished tall gentleman in no distress.The head and neck exam reveals pupils equal and reactive.  Extraocular movements are full.  There is no scleral icterus.  The mouth and pharynx are normal.  The neck is supple.  The carotids reveal no bruits.  The jugular venous pressure is normal.  The  thyroid is not enlarged.  There is no lymphadenopathy.  The chest is clear to percussion and auscultation.  There are no rales or rhonchi.  Expansion of the chest is symmetrical.  The precordium is quiet.  The first heart  sound is normal.  The second heart sound is physiologically split.  There is no murmur gallop rub or click.  There is no abnormal lift or heave.  The abdomen is soft and nontender.  The bowel sounds are normal.  The liver and spleen are not enlarged.  There are no abdominal masses.  There are no abdominal bruits.  Extremities reveal good pedal pulses.  There is no phlebitis or edema.  There is no cyanosis or clubbing.  Strength is normal and symmetrical in all extremities.  There is no lateralizing weakness.  There are no sensory deficits.  The skin is warm and dry.  There is no rash.   EKG today shows normal sinus rhythm with left bundle branch block and is unchanged since 12/04/12 except PVCs have now resolved.   Assessment / Plan: Continue  on same medication.  Recheck in 6 months for followup office visit lipid panel hepatic function panel basal metabolic panel. He will be seeing his PCP Dr. Tenny Craw for health maintenance issues such as colonoscopy and prostate check.

## 2013-11-27 ENCOUNTER — Other Ambulatory Visit: Payer: Self-pay | Admitting: Cardiology

## 2014-03-07 ENCOUNTER — Other Ambulatory Visit: Payer: Self-pay | Admitting: Cardiology

## 2014-05-02 ENCOUNTER — Ambulatory Visit (INDEPENDENT_AMBULATORY_CARE_PROVIDER_SITE_OTHER): Payer: 59 | Admitting: Cardiology

## 2014-05-02 ENCOUNTER — Encounter: Payer: Self-pay | Admitting: Cardiology

## 2014-05-02 VITALS — BP 120/82 | HR 58 | Ht 75.0 in | Wt 221.0 lb

## 2014-05-02 DIAGNOSIS — Z79899 Other long term (current) drug therapy: Secondary | ICD-10-CM

## 2014-05-02 DIAGNOSIS — I2510 Atherosclerotic heart disease of native coronary artery without angina pectoris: Secondary | ICD-10-CM

## 2014-05-02 DIAGNOSIS — I493 Ventricular premature depolarization: Secondary | ICD-10-CM

## 2014-05-02 DIAGNOSIS — E039 Hypothyroidism, unspecified: Secondary | ICD-10-CM

## 2014-05-02 DIAGNOSIS — E7800 Pure hypercholesterolemia, unspecified: Secondary | ICD-10-CM

## 2014-05-02 DIAGNOSIS — I4949 Other premature depolarization: Secondary | ICD-10-CM

## 2014-05-02 DIAGNOSIS — I259 Chronic ischemic heart disease, unspecified: Secondary | ICD-10-CM

## 2014-05-02 DIAGNOSIS — I447 Left bundle-branch block, unspecified: Secondary | ICD-10-CM

## 2014-05-02 LAB — HEPATIC FUNCTION PANEL
ALT: 22 U/L (ref 0–53)
AST: 19 U/L (ref 0–37)
Albumin: 3.9 g/dL (ref 3.5–5.2)
Alkaline Phosphatase: 48 U/L (ref 39–117)
BILIRUBIN DIRECT: 0.1 mg/dL (ref 0.0–0.3)
BILIRUBIN TOTAL: 0.8 mg/dL (ref 0.2–1.2)
Total Protein: 6.9 g/dL (ref 6.0–8.3)

## 2014-05-02 LAB — BASIC METABOLIC PANEL
BUN: 18 mg/dL (ref 6–23)
CALCIUM: 9.3 mg/dL (ref 8.4–10.5)
CO2: 27 meq/L (ref 19–32)
Chloride: 106 mEq/L (ref 96–112)
Creatinine, Ser: 1 mg/dL (ref 0.4–1.5)
GFR: 81.47 mL/min (ref >60.00)
Glucose, Bld: 106 mg/dL — ABNORMAL HIGH (ref 70–99)
Potassium: 4.3 mEq/L (ref 3.5–5.1)
Sodium: 138 mEq/L (ref 135–145)

## 2014-05-02 LAB — LIPID PANEL
CHOLESTEROL: 136 mg/dL (ref 0–200)
HDL: 61.8 mg/dL (ref >39.00)
LDL Cholesterol: 61 mg/dL (ref 0–99)
NONHDL: 74.2
Total CHOL/HDL Ratio: 2
Triglycerides: 67 mg/dL (ref 0.0–149.0)
VLDL: 13.4 mg/dL (ref 0.0–40.0)

## 2014-05-02 NOTE — Assessment & Plan Note (Signed)
Patient has a history of hypercholesterolemia.  He has had prior CABG.  He is on Crestor 20 mg daily without side effects.

## 2014-05-02 NOTE — Patient Instructions (Signed)
Will obtain labs today and call you with the results (lp/bmet/hfp)  Your physician recommends that you continue on your current medications as directed. Please refer to the Current Medication list given to you today.  Your physician wants you to follow-up in: 6 months with fasting labs (lp/bmet/hfp/cbc/tsh) and ekg  You will receive a reminder letter in the mail two months in advance. If you don't receive a letter, please call our office to schedule the follow-up appointment.

## 2014-05-02 NOTE — Assessment & Plan Note (Signed)
The patient has not had any recurrent chest pain or angina. 

## 2014-05-02 NOTE — Progress Notes (Signed)
Quick Note:  Please report to patient. The recent labs are stable. Continue same medication and careful diet. ______ 

## 2014-05-02 NOTE — Progress Notes (Signed)
[name] Date of Birth:  07-27-1946 [facility] [address] [place], [place]  [address] [phone]  Fax   [phone]  HPI: This pleasant 68 year old gentleman is seen for a six-month followup office visit. He has a history of ischemic heart disease and is status post coronary artery bypass graft surgery. He has a known left bundle branch block. He has had a previous EP study which was normal as part of an evaluation for syncope. He has a history of hypothyroidism and hyperlipidemia. He has been feeling well. He gets regular exercise. He is an Financial controller for soccer matches and he also plays soccer himself in order to exercise. During the off season from soccer he goes to the gym and works out. He also goes downhill skiing in [place] once each winter.   Current Outpatient Prescriptions  Medication Sig Dispense Refill  . aspirin 81 MG tablet Take 81 mg by mouth daily.        . CRESTOR 20 MG tablet Take 1 tablet by mouth  daily  90 tablet  0  . levothyroxine (SYNTHROID, LEVOTHROID) 150 MCG tablet Take 1 tablet (150 mcg total) by mouth daily.  90 tablet  3  . metoprolol (LOPRESSOR) 100 MG tablet TAKE HALF TABLET BY MOUTH TWICE DAILY   180 tablet  0  . naproxen (NAPROXEN DR) 500 MG EC tablet Take 500 mg by mouth as needed.        . tadalafil (CIALIS) 20 MG tablet Take 1 tablet (20 mg total) by mouth daily as needed.  6 tablet  1   No current facility-administered medications for this visit.    Not on File  Patient Active Problem List   Diagnosis Date Noted  . CAD (coronary artery disease)     Priority: Medium  . Elevated blood pressure reading 07/13/2011  . Hypothyroidism 02/24/2011  . Erectile dysfunction 02/24/2011  . LBBB (left bundle branch block)   . Syncope     History  Smoking status  . Never Smoker   Smokeless tobacco  . Not on file    History  Alcohol Use No    Family History  Problem Relation Age of Onset  . Heart attack Father   .  Ovarian cancer Mother     Review of Systems: The patient denies any heat or cold intolerance.  No weight gain or weight loss.  The patient denies headaches or blurry vision.  There is no cough or sputum production.  The patient denies dizziness.  There is no hematuria or hematochezia.  The patient denies any muscle aches or arthritis.  The patient denies any rash.  The patient denies frequent falling or instability.  There is no history of depression or anxiety.  All other systems were reviewed and are negative.   Physical Exam: Filed Vitals:   05/02/14 0912  BP: 120/82  Pulse: 58   the general appearance reveals a well-developed well-nourished tanned gentleman in no distress.The head and neck exam reveals pupils equal and reactive.  Extraocular movements are full.  There is no scleral icterus.  The mouth and pharynx are normal.  The neck is supple.  The carotids reveal no bruits.  The jugular venous pressure is normal.  The  thyroid is not enlarged.  There is no lymphadenopathy.  The chest is clear to percussion and auscultation.  There are no rales or rhonchi.  Expansion of the chest is symmetrical.  The precordium is quiet.  The first  heart sound is normal.  The second heart sound is physiologically split.  There is no murmur gallop rub or click.  There is no abnormal lift or heave.  The abdomen is soft and nontender.  The bowel sounds are normal.  The liver and spleen are not enlarged.  There are no abdominal masses.  There are no abdominal bruits.  Extremities reveal good pedal pulses.  There is no phlebitis or edema.  There is no cyanosis or clubbing.  Strength is normal and symmetrical in all extremities.  There is no lateralizing weakness.  There are no sensory deficits.  The skin is warm and dry.  There is no rash.      Assessment / Plan: 1. ischemic heart disease status post CABG 2. Hypercholesterolemia 3. hypothyroidism on Synthroid 4. left bundle branch block  Plan: We are  checking lipid panel hepatic function panel and nasal metabolic panel today. Continue same medication Return in 6 months for office visit EKG lipid panel hepatic function panel basal metabolic panel TSH and CBC

## 2014-05-02 NOTE — Assessment & Plan Note (Signed)
Patient has a known chronic left bundle-branch block.  He has not been having dizziness or syncope

## 2014-05-30 ENCOUNTER — Telehealth: Payer: Self-pay | Admitting: Cardiology

## 2014-05-30 NOTE — Telephone Encounter (Signed)
Yes he should still see his PCP for an annual physical.  They check things that we don't check.

## 2014-05-30 NOTE — Telephone Encounter (Signed)
Will forward to  Dr. Brackbill for review 

## 2014-05-30 NOTE — Telephone Encounter (Signed)
Patient wants to know since Dr Mare Ferrari has been doing all this testing. Does he really need to see his PCP for an annual physical? Please call and advise.

## 2014-05-30 NOTE — Telephone Encounter (Signed)
Left message to call back  

## 2014-05-31 NOTE — Telephone Encounter (Signed)
Follow up  ° ° ° °Returning call back to nurse  °

## 2014-05-31 NOTE — Telephone Encounter (Signed)
Advised patient

## 2014-06-01 ENCOUNTER — Other Ambulatory Visit: Payer: Self-pay | Admitting: Cardiology

## 2014-06-03 ENCOUNTER — Other Ambulatory Visit: Payer: Self-pay | Admitting: Cardiology

## 2014-08-01 ENCOUNTER — Encounter: Payer: Self-pay | Admitting: Gastroenterology

## 2014-08-16 ENCOUNTER — Telehealth: Payer: Self-pay | Admitting: Cardiology

## 2014-08-16 NOTE — Telephone Encounter (Signed)
Received request from Nurse fax box, documents faxed for surgical clearance. To: [facility] Fax number: [phone] Attention: [phone]/km

## 2014-08-21 ENCOUNTER — Encounter (HOSPITAL_COMMUNITY): Payer: Self-pay

## 2014-08-23 ENCOUNTER — Other Ambulatory Visit: Payer: Self-pay | Admitting: Physician Assistant

## 2014-08-23 NOTE — Pre-Procedure Instructions (Signed)
[name]  08/23/2014   Your procedure is scheduled on:  Wednesday, September 04, 2014 at 1:00 PM.   Report to [facility] Entrance "A" Admitting Office at 11:00 AM.   Call this number if you have problems the morning of surgery: [phone]   Remember:   Do not eat food or drink liquids after midnight Tuesday, 09/03/14.   Take these medicines the morning of surgery with A SIP OF WATER: levothyroxine (SYNTHROID, LEVOTHROID), metoprolol (LOPRESSOR) and eye drops.   Stop Aspirin and Aleve (Naproxen) as of Wednesday, 08/28/14.    Do not wear jewelry.  Do not wear lotions, powders, or cologne. You may wear deodorant.  Men may shave face and neck.  Do not bring valuables to the hospital.  [facility] is not responsible                  for any belongings or valuables.               Contacts, dentures or bridgework may not be worn into surgery.  Leave suitcase in the car. After surgery it may be brought to your room.  For patients admitted to the hospital, discharge time is determined by your                treatment team.                                Please read over the following fact sheets that you were given: Pain Booklet, Coughing and Deep Breathing, Blood Transfusion Information, MRSA Information and Surgical Site Infection Prevention

## 2014-08-23 NOTE — H&P (Signed)
TOTAL KNEE ADMISSION H&P  Patient is being admitted for right total knee arthroplasty.  Subjective:  Chief Complaint:right knee pain.  HPI: [name], 68 y.o. male, has a history of pain and functional disability in the right knee due to arthritis and has failed non-surgical conservative treatments for greater than 12 weeks to includeNSAID's and/or analgesics, corticosteriod injections and activity modification.  Onset of symptoms was gradual, starting 4 years ago with gradually worsening course since that time. The patient noted no past surgery on the right knee(s).  Patient currently rates pain in the right knee(s) at 5 out of 10 with activity. Patient has night pain, worsening of pain with activity and weight bearing, pain that interferes with activities of daily living, pain with passive range of motion, crepitus and joint swelling.  Patient has evidence of subchondral cysts, subchondral sclerosis, periarticular osteophytes and joint space narrowing by imaging studies. There is no active infection.  Patient Active Problem List   Diagnosis Date Noted  . Hypercholesterolemia 05/02/2014  . Elevated blood pressure reading 07/13/2011  . Hypothyroidism 02/24/2011  . Erectile dysfunction 02/24/2011  . CAD (coronary artery disease)   . LBBB (left bundle branch block)   . Syncope    Past Medical History  Diagnosis Date  . CAD (coronary artery disease)     s/p CABG x 5 in 2010; Dr. Darcey Nora  . LBBB (left bundle branch block)   . Syncope     Negative EP evaluation  . HTN (hypertension)   . Hyperlipidemia   . ED (erectile dysfunction)   . Hypothyroid     Past Surgical History  Procedure Laterality Date  . Coronary artery bypass graft  06/06/09    x 5  . Cardiac catheterization    . 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 Incisional hernia repair       (Not in a hospital admission) No Known Allergies  History  Substance Use Topics  . Smoking status: Never Smoker   .  Smokeless tobacco: Not on file  . Alcohol Use: No    Family History  Problem Relation Age of Onset  . Heart attack Father   . Ovarian cancer Mother      Review of Systems  Constitutional: Negative.   HENT: Negative.   Eyes: Negative.   Respiratory: Negative.   Cardiovascular: Negative.   Gastrointestinal: Negative.   Genitourinary: Negative.   Musculoskeletal: Positive for joint pain.  Skin: Negative.   Neurological: Negative.   Endo/Heme/Allergies: Negative.   Psychiatric/Behavioral: Negative.     Objective:  Physical Exam  Constitutional: He is oriented to person, place, and time. He appears well-developed and well-nourished.  HENT:  Head: Normocephalic and atraumatic.  Eyes: EOM are normal. Pupils are equal, round, and reactive to light.  Neck: Normal range of motion. Neck supple.  Cardiovascular: Normal rate, regular rhythm and normal heart sounds.   Respiratory: Effort normal and breath sounds normal.  GI: Soft. Bowel sounds are normal.  Musculoskeletal:  Specifically, antalgic gait and varus thrust, right greater than left.  Negative straight leg raise, both sides.  Negative log roll, both hips.  Sensation, pulses and capillary refill intact.  I am really not getting a Tinel's in any specific peripheral nerve.  Both knees full extension, 125 degrees of flexion.  5 degrees of varus on the right, correctable.  A little bit more than 0 into slight varus on the left.  Tender medial joint line, both sides.  Some patellofemoral  crepitus, both sides.  Trace effusion, both sides.   Neurological: He is alert and oriented to person, place, and time. He has normal reflexes.  Skin: Skin is warm and dry.  Psychiatric: He has a normal mood and affect. His behavior is normal. Judgment and thought content normal.    Vital signs in last 24 hours: @VSRANGES @  Labs:   Estimated body mass index is 27.62 kg/(m^2) as calculated from the following:   Height as of 05/02/14: 6\' 3"  (1.905  m).   Weight as of 05/02/14: 100.245 kg (221 lb).   Imaging Review Plain radiographs demonstrate severe degenerative joint disease of the right knee(s). The overall alignment ismild varus. The bone quality appears to be fair for age and reported activity level.  Assessment/Plan:  End stage arthritis, right knee   The patient history, physical examination, clinical judgment of the provider and imaging studies are consistent with end stage degenerative joint disease of the right knee(s) and total knee arthroplasty is deemed medically necessary. The treatment options including medical management, injection therapy arthroscopy and arthroplasty were discussed at length. The risks and benefits of total knee arthroplasty were presented and reviewed. The risks due to aseptic loosening, infection, stiffness, patella tracking problems, thromboembolic complications and other imponderables were discussed. The patient acknowledged the explanation, agreed to proceed with the plan and consent was signed. Patient is being admitted for inpatient treatment for surgery, pain control, PT, OT, prophylactic antibiotics, VTE prophylaxis, progressive ambulation and ADL's and discharge planning. The patient is planning to be discharged home with home health services

## 2014-08-26 ENCOUNTER — Encounter (HOSPITAL_COMMUNITY): Payer: Self-pay

## 2014-08-26 ENCOUNTER — Other Ambulatory Visit: Payer: Self-pay | Admitting: Physician Assistant

## 2014-08-26 ENCOUNTER — Encounter (HOSPITAL_COMMUNITY)
Admission: RE | Admit: 2014-08-26 | Discharge: 2014-08-26 | Disposition: A | Discharge status: Home / Self Care | Payer: 59 | Source: Ambulatory Visit | Attending: Orthopedic Surgery | Admitting: Orthopedic Surgery

## 2014-08-26 ENCOUNTER — Ambulatory Visit (HOSPITAL_COMMUNITY)
Admission: RE | Admit: 2014-08-26 | Discharge: 2014-08-26 | Disposition: A | Discharge status: Home / Self Care | Payer: 59 | Source: Ambulatory Visit | Attending: Physician Assistant | Admitting: Physician Assistant

## 2014-08-26 DIAGNOSIS — Z01818 Encounter for other preprocedural examination: Secondary | ICD-10-CM | POA: Insufficient documentation

## 2014-08-26 DIAGNOSIS — M179 Osteoarthritis of knee, unspecified: Secondary | ICD-10-CM | POA: Diagnosis not present

## 2014-08-26 HISTORY — DX: Malignant (primary) neoplasm, unspecified: C80.1

## 2014-08-26 LAB — COMPREHENSIVE METABOLIC PANEL
ALBUMIN: 3.7 g/dL (ref 3.5–5.2)
ALT: 16 U/L (ref 0–53)
AST: 16 U/L (ref 0–37)
Alkaline Phosphatase: 79 U/L (ref 39–117)
Anion gap: 12 (ref 5–15)
BUN: 18 mg/dL (ref 6–23)
CHLORIDE: 101 meq/L (ref 96–112)
CO2: 24 meq/L (ref 19–32)
Calcium: 9.2 mg/dL (ref 8.4–10.5)
Creatinine, Ser: 0.97 mg/dL (ref 0.50–1.35)
GFR calc Af Amer: >90 mL/min (ref >90)
GFR, EST NON AFRICAN AMERICAN: 85 mL/min — AB (ref >90)
Glucose, Bld: 110 mg/dL — ABNORMAL HIGH (ref 70–99)
Potassium: 4.1 mEq/L (ref 3.7–5.3)
SODIUM: 137 meq/L (ref 137–147)
Total Bilirubin: 0.5 mg/dL (ref 0.3–1.2)
Total Protein: 7.4 g/dL (ref 6.0–8.3)

## 2014-08-26 LAB — URINALYSIS, ROUTINE W REFLEX MICROSCOPIC
BILIRUBIN URINE: NEGATIVE
Glucose, UA: NEGATIVE mg/dL
Hgb urine dipstick: NEGATIVE
Ketones, ur: NEGATIVE mg/dL
LEUKOCYTES UA: NEGATIVE
NITRITE: NEGATIVE
PH: 5 (ref 5.0–8.0)
PROTEIN: NEGATIVE mg/dL
Specific Gravity, Urine: 1.016 (ref 1.005–1.030)
Urobilinogen, UA: 0.2 mg/dL (ref 0.0–1.0)

## 2014-08-26 LAB — PROTIME-INR
INR: 1.01 (ref 0.00–1.49)
Prothrombin Time: 13.3 seconds (ref 11.6–15.2)

## 2014-08-26 LAB — CBC WITH DIFFERENTIAL/PLATELET
Basophils Absolute: 0 10*3/uL (ref 0.0–0.1)
Basophils Relative: 1 % (ref 0–1)
Eosinophils Absolute: 0 10*3/uL (ref 0.0–0.7)
Eosinophils Relative: 1 % (ref 0–5)
HCT: 42.5 % (ref 39.0–52.0)
HEMOGLOBIN: 14.4 g/dL (ref 13.0–17.0)
LYMPHS ABS: 1.8 10*3/uL (ref 0.7–4.0)
Lymphocytes Relative: 43 % (ref 12–46)
MCH: 29 pg (ref 26.0–34.0)
MCHC: 33.9 g/dL (ref 30.0–36.0)
MCV: 85.5 fL (ref 78.0–100.0)
MONOS PCT: 10 % (ref 3–12)
Monocytes Absolute: 0.4 10*3/uL (ref 0.1–1.0)
NEUTROS ABS: 1.9 10*3/uL (ref 1.7–7.7)
NEUTROS PCT: 45 % (ref 43–77)
Platelets: 140 10*3/uL — ABNORMAL LOW (ref 150–400)
RBC: 4.97 MIL/uL (ref 4.22–5.81)
RDW: 13.4 % (ref 11.5–15.5)
WBC: 4.2 10*3/uL (ref 4.0–10.5)

## 2014-08-26 LAB — SURGICAL PCR SCREEN
MRSA, PCR: NEGATIVE
STAPHYLOCOCCUS AUREUS: NEGATIVE

## 2014-08-26 LAB — TYPE AND SCREEN
ABO/RH(D): A POS
ANTIBODY SCREEN: NEGATIVE

## 2014-08-26 LAB — APTT: APTT: 31 s (ref 24–37)

## 2014-08-26 NOTE — Progress Notes (Signed)
Primary - dr. Harle Battiest Cardiologist - dr. Mare Ferrari Clearance on chart ekg in nov 2014

## 2014-08-27 LAB — URINE CULTURE
CULTURE: NO GROWTH
Colony Count: NO GROWTH

## 2014-09-03 MED ORDER — CHLORHEXIDINE GLUCONATE 4 % EX LIQD
60.0000 mL | Freq: Once | CUTANEOUS | Status: DC
  Filled 2014-09-03: qty 60

## 2014-09-03 MED ORDER — LACTATED RINGERS IV SOLN: INTRAVENOUS | Status: DC

## 2014-09-03 MED ORDER — CEFAZOLIN SODIUM-DEXTROSE 2-3 GM-% IV SOLR
2.0000 g | INTRAVENOUS | Status: AC
  Administered 2014-09-04: 2 g via INTRAVENOUS
  Filled 2014-09-03: qty 50

## 2014-09-03 MED ORDER — LACTATED RINGERS IV SOLN
INTRAVENOUS | Status: DC
  Administered 2014-09-04: 12:00:00 via INTRAVENOUS

## 2014-09-03 MED ORDER — CEFAZOLIN SODIUM-DEXTROSE 2-3 GM-% IV SOLR: 2.0000 g | INTRAVENOUS | Status: DC

## 2014-09-04 ENCOUNTER — Inpatient Hospital Stay (HOSPITAL_COMMUNITY): Payer: 59 | Admitting: Anesthesiology

## 2014-09-04 ENCOUNTER — Encounter (HOSPITAL_COMMUNITY): Payer: 59 | Admitting: Anesthesiology

## 2014-09-04 ENCOUNTER — Encounter (HOSPITAL_COMMUNITY)
Admission: RE | Disposition: A | Discharge status: Home Health | Payer: Self-pay | Source: Ambulatory Visit | Attending: Orthopedic Surgery

## 2014-09-04 ENCOUNTER — Inpatient Hospital Stay (HOSPITAL_COMMUNITY)
Admission: RE | Admit: 2014-09-04 | Discharge: 2014-09-05 | DRG: 470 | Disposition: A | Discharge status: Home Health | Payer: 59 | Source: Ambulatory Visit | Attending: Orthopedic Surgery | Admitting: Orthopedic Surgery

## 2014-09-04 ENCOUNTER — Inpatient Hospital Stay (HOSPITAL_COMMUNITY): Payer: 59

## 2014-09-04 ENCOUNTER — Encounter (HOSPITAL_COMMUNITY): Payer: Self-pay | Admitting: Anesthesiology

## 2014-09-04 DIAGNOSIS — D62 Acute posthemorrhagic anemia: Secondary | ICD-10-CM | POA: Diagnosis not present

## 2014-09-04 DIAGNOSIS — M25561 Pain in right knee: Secondary | ICD-10-CM | POA: Diagnosis present

## 2014-09-04 DIAGNOSIS — Z951 Presence of aortocoronary bypass graft: Secondary | ICD-10-CM | POA: Diagnosis not present

## 2014-09-04 DIAGNOSIS — I10 Essential (primary) hypertension: Secondary | ICD-10-CM | POA: Diagnosis present

## 2014-09-04 DIAGNOSIS — E785 Hyperlipidemia, unspecified: Secondary | ICD-10-CM | POA: Diagnosis present

## 2014-09-04 DIAGNOSIS — M179 Osteoarthritis of knee, unspecified: Secondary | ICD-10-CM | POA: Diagnosis present

## 2014-09-04 DIAGNOSIS — E039 Hypothyroidism, unspecified: Secondary | ICD-10-CM | POA: Diagnosis present

## 2014-09-04 DIAGNOSIS — Z8249 Family history of ischemic heart disease and other diseases of the circulatory system: Secondary | ICD-10-CM | POA: Diagnosis not present

## 2014-09-04 DIAGNOSIS — M1711 Unilateral primary osteoarthritis, right knee: Secondary | ICD-10-CM

## 2014-09-04 DIAGNOSIS — M170 Bilateral primary osteoarthritis of knee: Principal | ICD-10-CM | POA: Diagnosis present

## 2014-09-04 DIAGNOSIS — I2510 Atherosclerotic heart disease of native coronary artery without angina pectoris: Secondary | ICD-10-CM | POA: Diagnosis present

## 2014-09-04 DIAGNOSIS — M1710 Unilateral primary osteoarthritis, unspecified knee: Secondary | ICD-10-CM | POA: Diagnosis present

## 2014-09-04 HISTORY — PX: TOTAL KNEE ARTHROPLASTY: SHX125

## 2014-09-04 SURGERY — ARTHROPLASTY, KNEE, TOTAL
Anesthesia: Spinal | Site: Knee | Laterality: Bilateral

## 2014-09-04 MED ORDER — CELECOXIB 200 MG PO CAPS
200.0000 mg | ORAL_CAPSULE | Freq: Two times a day (BID) | ORAL | Status: DC
  Administered 2014-09-04 – 2014-09-05 (×2): 200 mg via ORAL
  Filled 2014-09-04 (×3): qty 1

## 2014-09-04 MED ORDER — PROPOFOL 10 MG/ML IV BOLUS
INTRAVENOUS | Status: AC
  Filled 2014-09-04: qty 20

## 2014-09-04 MED ORDER — KETOROLAC TROMETHAMINE 0.5 % OP SOLN
1.0000 [drp] | Freq: Every day | OPHTHALMIC | Status: DC
  Administered 2014-09-05: 1 [drp] via OPHTHALMIC
  Filled 2014-09-04: qty 3

## 2014-09-04 MED ORDER — BUPIVACAINE IN DEXTROSE 0.75-8.25 % IT SOLN
INTRATHECAL | Status: DC | PRN
  Administered 2014-09-04: 2 mL via INTRATHECAL

## 2014-09-04 MED ORDER — PROMETHAZINE HCL 25 MG/ML IJ SOLN: 6.2500 mg | INTRAMUSCULAR | Status: DC | PRN

## 2014-09-04 MED ORDER — DIPHENHYDRAMINE HCL 12.5 MG/5ML PO ELIX: 12.5000 mg | ORAL_SOLUTION | ORAL | Status: DC | PRN

## 2014-09-04 MED ORDER — OXYCODONE HCL 5 MG/5ML PO SOLN: 5.0000 mg | Freq: Once | ORAL | Status: AC | PRN

## 2014-09-04 MED ORDER — OXYCODONE HCL 5 MG PO TABS
ORAL_TABLET | ORAL | Status: AC
  Administered 2014-09-04: 16:00:00
  Filled 2014-09-04: qty 1

## 2014-09-04 MED ORDER — ACETAMINOPHEN 325 MG PO TABS
650.0000 mg | ORAL_TABLET | Freq: Four times a day (QID) | ORAL | Status: DC | PRN
  Administered 2014-09-04: 650 mg via ORAL
  Filled 2014-09-04: qty 2

## 2014-09-04 MED ORDER — BUPIVACAINE HCL (PF) 0.5 % IJ SOLN
INTRAMUSCULAR | Status: DC | PRN
  Administered 2014-09-04: 10 mL

## 2014-09-04 MED ORDER — BUPIVACAINE HCL 0.5 % IJ SOLN
INTRAMUSCULAR | Status: DC | PRN
  Administered 2014-09-04: 7 mg

## 2014-09-04 MED ORDER — OXYCODONE-ACETAMINOPHEN 5-325 MG PO TABS: 1.0000 | ORAL_TABLET | ORAL | Status: DC | PRN

## 2014-09-04 MED ORDER — CEFAZOLIN SODIUM-DEXTROSE 2-3 GM-% IV SOLR
2.0000 g | Freq: Four times a day (QID) | INTRAVENOUS | Status: AC
  Administered 2014-09-04 – 2014-09-05 (×2): 2 g via INTRAVENOUS
  Filled 2014-09-04 (×2): qty 50

## 2014-09-04 MED ORDER — FENTANYL CITRATE 0.05 MG/ML IJ SOLN
INTRAMUSCULAR | Status: DC | PRN
  Administered 2014-09-04: 25 ug via INTRAVENOUS
  Administered 2014-09-04: 50 ug via INTRAVENOUS
  Administered 2014-09-04: 25 ug via INTRAVENOUS
  Administered 2014-09-04: 50 ug via INTRAVENOUS

## 2014-09-04 MED ORDER — ALUM & MAG HYDROXIDE-SIMETH 200-200-20 MG/5ML PO SUSP: 30.0000 mL | ORAL | Status: DC | PRN

## 2014-09-04 MED ORDER — PHENOL 1.4 % MT LIQD: 1.0000 | OROMUCOSAL | Status: DC | PRN

## 2014-09-04 MED ORDER — DIFLUPREDNATE 0.05 % OP EMUL
1.0000 [drp] | Freq: Three times a day (TID) | OPHTHALMIC | Status: DC
  Administered 2014-09-05: 1 [drp] via OPHTHALMIC

## 2014-09-04 MED ORDER — ONDANSETRON HCL 4 MG PO TABS
4.0000 mg | ORAL_TABLET | Freq: Four times a day (QID) | ORAL | Status: DC | PRN
  Administered 2014-09-05 (×2): 4 mg via ORAL
  Filled 2014-09-04 (×2): qty 1

## 2014-09-04 MED ORDER — BUPIVACAINE HCL (PF) 0.5 % IJ SOLN
INTRAMUSCULAR | Status: AC
  Filled 2014-09-04: qty 10

## 2014-09-04 MED ORDER — HYDROMORPHONE HCL 1 MG/ML IJ SOLN
INTRAMUSCULAR | Status: AC
  Administered 2014-09-04: 16:00:00
  Filled 2014-09-04: qty 1

## 2014-09-04 MED ORDER — SODIUM CHLORIDE 0.9 % IR SOLN
Status: DC | PRN
  Administered 2014-09-04: 2000 mL

## 2014-09-04 MED ORDER — FENTANYL CITRATE 0.05 MG/ML IJ SOLN
INTRAMUSCULAR | Status: AC
  Filled 2014-09-04: qty 5

## 2014-09-04 MED ORDER — METOPROLOL TARTRATE 50 MG PO TABS
50.0000 mg | ORAL_TABLET | Freq: Two times a day (BID) | ORAL | Status: DC
  Administered 2014-09-05: 50 mg via ORAL
  Filled 2014-09-04 (×3): qty 1

## 2014-09-04 MED ORDER — LIDOCAINE HCL (CARDIAC) 20 MG/ML IV SOLN
INTRAVENOUS | Status: AC
  Filled 2014-09-04: qty 5

## 2014-09-04 MED ORDER — ASPIRIN EC 325 MG PO TBEC: 325.0000 mg | DELAYED_RELEASE_TABLET | Freq: Every day | ORAL | Status: DC

## 2014-09-04 MED ORDER — ZOLPIDEM TARTRATE 5 MG PO TABS: 5.0000 mg | ORAL_TABLET | Freq: Every evening | ORAL | Status: DC | PRN

## 2014-09-04 MED ORDER — ACETAMINOPHEN 650 MG RE SUPP: 650.0000 mg | Freq: Four times a day (QID) | RECTAL | Status: DC | PRN

## 2014-09-04 MED ORDER — METHOCARBAMOL 1000 MG/10ML IJ SOLN
500.0000 mg | Freq: Four times a day (QID) | INTRAMUSCULAR | Status: DC | PRN
  Filled 2014-09-04: qty 5

## 2014-09-04 MED ORDER — LIDOCAINE HCL (CARDIAC) 20 MG/ML IV SOLN
INTRAVENOUS | Status: DC | PRN
  Administered 2014-09-04: 50 mg via INTRAVENOUS

## 2014-09-04 MED ORDER — OXYCODONE HCL 5 MG PO TABS
5.0000 mg | ORAL_TABLET | ORAL | Status: DC | PRN
  Administered 2014-09-04 (×2): 5 mg via ORAL
  Administered 2014-09-04 – 2014-09-05 (×6): 10 mg via ORAL
  Filled 2014-09-04 (×5): qty 2
  Filled 2014-09-04 (×2): qty 1
  Filled 2014-09-04: qty 2

## 2014-09-04 MED ORDER — HYDROMORPHONE HCL 1 MG/ML IJ SOLN
0.5000 mg | INTRAMUSCULAR | Status: DC | PRN
  Administered 2014-09-04 – 2014-09-05 (×3): 1 mg via INTRAVENOUS
  Filled 2014-09-04 (×4): qty 1

## 2014-09-04 MED ORDER — GATIFLOXACIN 0.5 % OP SOLN
1.0000 [drp] | Freq: Three times a day (TID) | OPHTHALMIC | Status: DC
Start: 2014-09-04 — End: 2014-09-05
  Administered 2014-09-05: 1 [drp] via OPHTHALMIC
  Filled 2014-09-04: qty 2.5

## 2014-09-04 MED ORDER — ROSUVASTATIN CALCIUM 20 MG PO TABS
20.0000 mg | ORAL_TABLET | Freq: Every day | ORAL | Status: DC
  Administered 2014-09-05: 20 mg via ORAL
  Filled 2014-09-04: qty 1

## 2014-09-04 MED ORDER — SODIUM CHLORIDE 0.9 % IJ SOLN
INTRAMUSCULAR | Status: DC | PRN
  Administered 2014-09-04: 40 mL via INTRAVENOUS

## 2014-09-04 MED ORDER — METHYLPREDNISOLONE ACETATE 80 MG/ML IJ SUSP
INTRAMUSCULAR | Status: DC | PRN
  Administered 2014-09-04: 80 mg via INTRA_ARTICULAR

## 2014-09-04 MED ORDER — BISACODYL 5 MG PO TBEC: 5.0000 mg | DELAYED_RELEASE_TABLET | Freq: Every day | ORAL | Status: DC | PRN

## 2014-09-04 MED ORDER — ONDANSETRON HCL 4 MG PO TABS: 4.0000 mg | ORAL_TABLET | Freq: Three times a day (TID) | ORAL | Status: DC | PRN

## 2014-09-04 MED ORDER — METHYLPREDNISOLONE ACETATE 80 MG/ML IJ SUSP
INTRAMUSCULAR | Status: AC
  Filled 2014-09-04: qty 1

## 2014-09-04 MED ORDER — METHOCARBAMOL 500 MG PO TABS: 500.0000 mg | ORAL_TABLET | Freq: Four times a day (QID) | ORAL | Status: DC

## 2014-09-04 MED ORDER — HYDROMORPHONE HCL 1 MG/ML IJ SOLN
0.2500 mg | INTRAMUSCULAR | Status: DC | PRN
  Administered 2014-09-04 (×2): 0.5 mg via INTRAVENOUS

## 2014-09-04 MED ORDER — ONDANSETRON HCL 4 MG/2ML IJ SOLN
4.0000 mg | Freq: Four times a day (QID) | INTRAMUSCULAR | Status: DC | PRN
  Administered 2014-09-04 – 2014-09-05 (×2): 4 mg via INTRAVENOUS
  Filled 2014-09-04 (×2): qty 2

## 2014-09-04 MED ORDER — BUPIVACAINE LIPOSOME 1.3 % IJ SUSP
20.0000 mL | INTRAMUSCULAR | Status: AC
  Administered 2014-09-04: 20 mL
  Filled 2014-09-04: qty 20

## 2014-09-04 MED ORDER — LEVOTHYROXINE SODIUM 150 MCG PO TABS
150.0000 ug | ORAL_TABLET | Freq: Every day | ORAL | Status: DC
  Administered 2014-09-05: 150 ug via ORAL
  Filled 2014-09-04 (×2): qty 1

## 2014-09-04 MED ORDER — ASPIRIN EC 325 MG PO TBEC
325.0000 mg | DELAYED_RELEASE_TABLET | Freq: Every day | ORAL | Status: DC
  Administered 2014-09-05: 325 mg via ORAL
  Filled 2014-09-04 (×2): qty 1

## 2014-09-04 MED ORDER — MIDAZOLAM HCL 5 MG/5ML IJ SOLN
INTRAMUSCULAR | Status: DC | PRN
  Administered 2014-09-04: 2 mg via INTRAVENOUS

## 2014-09-04 MED ORDER — DOCUSATE SODIUM 100 MG PO CAPS
100.0000 mg | ORAL_CAPSULE | Freq: Two times a day (BID) | ORAL | Status: DC
  Administered 2014-09-04 – 2014-09-05 (×2): 100 mg via ORAL
  Filled 2014-09-04 (×2): qty 1

## 2014-09-04 MED ORDER — OXYCODONE HCL 5 MG PO TABS
5.0000 mg | ORAL_TABLET | Freq: Once | ORAL | Status: AC | PRN
  Administered 2014-09-04: 5 mg via ORAL

## 2014-09-04 MED ORDER — METOCLOPRAMIDE HCL 10 MG PO TABS: 5.0000 mg | ORAL_TABLET | Freq: Three times a day (TID) | ORAL | Status: DC | PRN

## 2014-09-04 MED ORDER — PROPOFOL INFUSION 10 MG/ML OPTIME
INTRAVENOUS | Status: DC | PRN
  Administered 2014-09-04: 50 ug/kg/min via INTRAVENOUS

## 2014-09-04 MED ORDER — METHOCARBAMOL 500 MG PO TABS
500.0000 mg | ORAL_TABLET | Freq: Four times a day (QID) | ORAL | Status: DC | PRN
  Administered 2014-09-04 – 2014-09-05 (×3): 500 mg via ORAL
  Filled 2014-09-04 (×3): qty 1

## 2014-09-04 MED ORDER — MIDAZOLAM HCL 2 MG/2ML IJ SOLN
INTRAMUSCULAR | Status: AC
  Filled 2014-09-04: qty 2

## 2014-09-04 MED ORDER — POTASSIUM CHLORIDE IN NACL 20-0.9 MEQ/L-% IV SOLN
INTRAVENOUS | Status: DC
  Administered 2014-09-04: 100 mL/h via INTRAVENOUS
  Administered 2014-09-05: 07:00:00 via INTRAVENOUS
  Filled 2014-09-04 (×4): qty 1000

## 2014-09-04 MED ORDER — METHOCARBAMOL 500 MG PO TABS
ORAL_TABLET | ORAL | Status: AC
  Administered 2014-09-04: 16:00:00
  Filled 2014-09-04: qty 1

## 2014-09-04 MED ORDER — ONDANSETRON HCL 4 MG/2ML IJ SOLN
INTRAMUSCULAR | Status: AC
  Filled 2014-09-04: qty 2

## 2014-09-04 MED ORDER — METOCLOPRAMIDE HCL 5 MG/ML IJ SOLN: 5.0000 mg | Freq: Three times a day (TID) | INTRAMUSCULAR | Status: DC | PRN

## 2014-09-04 MED ORDER — MENTHOL 3 MG MT LOZG
1.0000 | LOZENGE | OROMUCOSAL | Status: DC | PRN
Start: 2014-09-04 — End: 2014-09-05

## 2014-09-04 SURGICAL SUPPLY — 62 items
BANDAGE ELASTIC 4 VELCRO ST LF (GAUZE/BANDAGES/DRESSINGS) ×3 IMPLANT
BANDAGE ELASTIC 6 VELCRO ST LF (GAUZE/BANDAGES/DRESSINGS) ×3 IMPLANT
BANDAGE ESMARK 6X9 LF (GAUZE/BANDAGES/DRESSINGS) ×1 IMPLANT
BENZOIN TINCTURE PRP APPL 2/3 (GAUZE/BANDAGES/DRESSINGS) ×3 IMPLANT
BLADE SAG 18X100X1.27 (BLADE) ×6 IMPLANT
BNDG ESMARK 6X9 LF (GAUZE/BANDAGES/DRESSINGS) ×3
BOWL SMART MIX CTS (DISPOSABLE) ×3 IMPLANT
CEMENT BONE SIMPLEX SPEEDSET (Cement) ×3 IMPLANT
CLOSURE STERI-STRIP 1/2X4 (GAUZE/BANDAGES/DRESSINGS) ×1
CLOSURE WOUND 1/2 X4 (GAUZE/BANDAGES/DRESSINGS) ×2
CLSR STERI-STRIP ANTIMIC 1/2X4 (GAUZE/BANDAGES/DRESSINGS) ×2 IMPLANT
COVER SURGICAL LIGHT HANDLE (MISCELLANEOUS) ×3 IMPLANT
CUFF TOURNIQUET SINGLE 34IN LL (TOURNIQUET CUFF) ×3 IMPLANT
DRAPE EXTREMITY T 121X128X90 (DRAPE) ×3 IMPLANT
DRAPE PROXIMA HALF (DRAPES) ×3 IMPLANT
DRAPE U-SHAPE 47X51 STRL (DRAPES) ×3 IMPLANT
DRSG PAD ABDOMINAL 8X10 ST (GAUZE/BANDAGES/DRESSINGS) ×3 IMPLANT
DURAPREP 26ML APPLICATOR (WOUND CARE) ×6 IMPLANT
ELECT CAUTERY BLADE 6.4 (BLADE) ×3 IMPLANT
ELECT REM PT RETURN 9FT ADLT (ELECTROSURGICAL) ×3
ELECTRODE REM PT RTRN 9FT ADLT (ELECTROSURGICAL) ×1 IMPLANT
EVACUATOR 1/8 PVC DRAIN (DRAIN) ×3 IMPLANT
FACESHIELD WRAPAROUND (MASK) ×6 IMPLANT
GAUZE SPONGE 4X4 12PLY STRL (GAUZE/BANDAGES/DRESSINGS) ×3 IMPLANT
GLOVE BIOGEL PI IND STRL 7.0 (GLOVE) ×2 IMPLANT
GLOVE BIOGEL PI INDICATOR 7.0 (GLOVE) ×4
GLOVE ECLIPSE 6.5 STRL STRAW (GLOVE) ×6 IMPLANT
GLOVE ORTHO TXT STRL SZ7.5 (GLOVE) ×3 IMPLANT
GOWN STRL REUS W/ TWL LRG LVL3 (GOWN DISPOSABLE) ×1 IMPLANT
GOWN STRL REUS W/ TWL XL LVL3 (GOWN DISPOSABLE) ×1 IMPLANT
GOWN STRL REUS W/TWL LRG LVL3 (GOWN DISPOSABLE) ×2
GOWN STRL REUS W/TWL XL LVL3 (GOWN DISPOSABLE) ×2
HANDPIECE INTERPULSE COAX TIP (DISPOSABLE) ×2
IMMOBILIZER KNEE 24 THIGH 36 (MISCELLANEOUS) ×1 IMPLANT
IMMOBILIZER KNEE 24 UNIV (MISCELLANEOUS) ×3
KIT BASIN OR (CUSTOM PROCEDURE TRAY) ×3 IMPLANT
KIT ROOM TURNOVER OR (KITS) ×3 IMPLANT
KNEE/VIT E POLY LINER LEVEL 1B ×3 IMPLANT
MANIFOLD NEPTUNE II (INSTRUMENTS) ×3 IMPLANT
NEEDLE 18GX1X1/2 (RX/OR ONLY) (NEEDLE) ×3 IMPLANT
NEEDLE 25GX 5/8IN NON SAFETY (NEEDLE) ×3 IMPLANT
NS IRRIG 1000ML POUR BTL (IV SOLUTION) ×3 IMPLANT
PACK TOTAL JOINT (CUSTOM PROCEDURE TRAY) ×3 IMPLANT
PAD ARMBOARD 7.5X6 YLW CONV (MISCELLANEOUS) ×6 IMPLANT
PAD CAST 4YDX4 CTTN HI CHSV (CAST SUPPLIES) ×1 IMPLANT
PADDING CAST COTTON 4X4 STRL (CAST SUPPLIES) ×2
PADDING CAST COTTON 6X4 STRL (CAST SUPPLIES) ×3 IMPLANT
SET HNDPC FAN SPRY TIP SCT (DISPOSABLE) ×1 IMPLANT
STRIP CLOSURE SKIN 1/2X4 (GAUZE/BANDAGES/DRESSINGS) ×4 IMPLANT
SUCTION FRAZIER TIP 10 FR DISP (SUCTIONS) ×3 IMPLANT
SUT MNCRL AB 4-0 PS2 18 (SUTURE) ×3 IMPLANT
SUT VIC AB 0 CT1 27 (SUTURE)
SUT VIC AB 0 CT1 27XBRD ANBCTR (SUTURE) IMPLANT
SUT VIC AB 1 CT1 27 (SUTURE) ×4
SUT VIC AB 1 CT1 27XBRD ANBCTR (SUTURE) ×2 IMPLANT
SUT VIC AB 2-0 CT1 27 (SUTURE) ×4
SUT VIC AB 2-0 CT1 TAPERPNT 27 (SUTURE) ×2 IMPLANT
SYR 50ML LL SCALE MARK (SYRINGE) ×3 IMPLANT
SYR CONTROL 10ML LL (SYRINGE) ×3 IMPLANT
TOWEL OR 17X24 6PK STRL BLUE (TOWEL DISPOSABLE) ×3 IMPLANT
TOWEL OR 17X26 10 PK STRL BLUE (TOWEL DISPOSABLE) ×3 IMPLANT
WATER STERILE IRR 1000ML POUR (IV SOLUTION) ×6 IMPLANT

## 2014-09-04 NOTE — H&P (View-Only) (Signed)
TOTAL KNEE ADMISSION H&P  Patient is being admitted for right total knee arthroplasty.  Subjective:  Chief Complaint:right knee pain.  HPI: [name], 68 y.o. male, has a history of pain and functional disability in the right knee due to arthritis and has failed non-surgical conservative treatments for greater than 12 weeks to includeNSAID's and/or analgesics, corticosteriod injections and activity modification.  Onset of symptoms was gradual, starting 4 years ago with gradually worsening course since that time. The patient noted no past surgery on the right knee(s).  Patient currently rates pain in the right knee(s) at 5 out of 10 with activity. Patient has night pain, worsening of pain with activity and weight bearing, pain that interferes with activities of daily living, pain with passive range of motion, crepitus and joint swelling.  Patient has evidence of subchondral cysts, subchondral sclerosis, periarticular osteophytes and joint space narrowing by imaging studies. There is no active infection.  Patient Active Problem List   Diagnosis Date Noted  . Hypercholesterolemia 05/02/2014  . Elevated blood pressure reading 07/13/2011  . Hypothyroidism 02/24/2011  . Erectile dysfunction 02/24/2011  . CAD (coronary artery disease)   . LBBB (left bundle branch block)   . Syncope    Past Medical History  Diagnosis Date  . CAD (coronary artery disease)     s/p CABG x 5 in 2010; Dr. Darcey Nora  . LBBB (left bundle branch block)   . Syncope     Negative EP evaluation  . HTN (hypertension)   . Hyperlipidemia   . ED (erectile dysfunction)   . Hypothyroid     Past Surgical History  Procedure Laterality Date  . Coronary artery bypass graft  06/06/09    x 5  . Cardiac catheterization    . 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 Incisional hernia repair       (Not in a hospital admission) No Known Allergies  History  Substance Use Topics  . Smoking status: Never Smoker   .  Smokeless tobacco: Not on file  . Alcohol Use: No    Family History  Problem Relation Age of Onset  . Heart attack Father   . Ovarian cancer Mother      Review of Systems  Constitutional: Negative.   HENT: Negative.   Eyes: Negative.   Respiratory: Negative.   Cardiovascular: Negative.   Gastrointestinal: Negative.   Genitourinary: Negative.   Musculoskeletal: Positive for joint pain.  Skin: Negative.   Neurological: Negative.   Endo/Heme/Allergies: Negative.   Psychiatric/Behavioral: Negative.     Objective:  Physical Exam  Constitutional: He is oriented to person, place, and time. He appears well-developed and well-nourished.  HENT:  Head: Normocephalic and atraumatic.  Eyes: EOM are normal. Pupils are equal, round, and reactive to light.  Neck: Normal range of motion. Neck supple.  Cardiovascular: Normal rate, regular rhythm and normal heart sounds.   Respiratory: Effort normal and breath sounds normal.  GI: Soft. Bowel sounds are normal.  Musculoskeletal:  Specifically, antalgic gait and varus thrust, right greater than left.  Negative straight leg raise, both sides.  Negative log roll, both hips.  Sensation, pulses and capillary refill intact.  I am really not getting a Tinel's in any specific peripheral nerve.  Both knees full extension, 125 degrees of flexion.  5 degrees of varus on the right, correctable.  A little bit more than 0 into slight varus on the left.  Tender medial joint line, both sides.  Some patellofemoral  crepitus, both sides.  Trace effusion, both sides.   Neurological: He is alert and oriented to person, place, and time. He has normal reflexes.  Skin: Skin is warm and dry.  Psychiatric: He has a normal mood and affect. His behavior is normal. Judgment and thought content normal.    Vital signs in last 24 hours: @VSRANGES @  Labs:   Estimated body mass index is 27.62 kg/(m^2) as calculated from the following:   Height as of 05/02/14: 6\' 3"  (1.905  m).   Weight as of 05/02/14: 100.245 kg (221 lb).   Imaging Review Plain radiographs demonstrate severe degenerative joint disease of the right knee(s). The overall alignment ismild varus. The bone quality appears to be fair for age and reported activity level.  Assessment/Plan:  End stage arthritis, right knee   The patient history, physical examination, clinical judgment of the provider and imaging studies are consistent with end stage degenerative joint disease of the right knee(s) and total knee arthroplasty is deemed medically necessary. The treatment options including medical management, injection therapy arthroscopy and arthroplasty were discussed at length. The risks and benefits of total knee arthroplasty were presented and reviewed. The risks due to aseptic loosening, infection, stiffness, patella tracking problems, thromboembolic complications and other imponderables were discussed. The patient acknowledged the explanation, agreed to proceed with the plan and consent was signed. Patient is being admitted for inpatient treatment for surgery, pain control, PT, OT, prophylactic antibiotics, VTE prophylaxis, progressive ambulation and ADL's and discharge planning. The patient is planning to be discharged home with home health services

## 2014-09-04 NOTE — Discharge Summary (Signed)
Patient ID: [name] MRN: [id number] DOB/AGE: [birth year] 68 y.o.  Admit date: 09/04/2014 Discharge date: 09/05/2014  Admission Diagnoses:  Active Problems:   DJD (degenerative joint disease) of knee   Discharge Diagnoses:  Same  Past Medical History  Diagnosis Date  . CAD (coronary artery disease)     s/p CABG x 5 in 2010; Dr. Darcey Nora  . Syncope     Negative EP evaluation  . HTN (hypertension)   . Hyperlipidemia   . ED (erectile dysfunction)   . Hypothyroid   . LBBB (left bundle branch block)   . Cancer 2005    throat    Surgeries: Procedure(s): Intra-articular injection Left knee, Excision of Chronic Pre-patella Bursa Right Knee , Right Total Knee Arthroplasty on 09/04/2014   Consultants:    Discharged Condition: Improved  Hospital Course: [name] is an 68 y.o. male who was admitted 09/04/2014 for operative treatment of right knee osteoarthritis. Patient has severe unremitting pain that affects sleep, daily activities, and work/hobbies. After pre-op clearance the patient was taken to the operating room on 09/04/2014 and underwent  Procedure(s): Intra-articular injection Left knee, Excision of Chronic Pre-patella Bursa Right Knee , Right Total Knee Arthroplasty.  Patient is doing extremely well overall.  He did develop ABLA on POD#1 with a hgb of 10.4 (pre-op hgb 14.4).  Although he is asymptomatic, we will continue to follow.  Patient was given perioperative antibiotics:     Anti-infectives   Start     Dose/Rate Route Frequency Ordered Stop   09/04/14 1800  ceFAZolin (ANCEF) IVPB 2 g/50 mL premix     2 g 100 mL/hr over 30 Minutes Intravenous Every 6 hours 09/04/14 1652 09/05/14 0438   09/04/14 0600  ceFAZolin (ANCEF) IVPB 2 g/50 mL premix     2 g 100 mL/hr over 30 Minutes Intravenous On call to O.R. 09/03/14 1446 09/04/14 1145   09/04/14 0600  ceFAZolin (ANCEF) IVPB 2 g/50 mL premix  Status:  Discontinued     2 g 100 mL/hr over 30 Minutes Intravenous  On call to O.R. 09/03/14 1507 09/04/14 1640       Patient was given sequential compression devices, early ambulation, and chemoprophylaxis to prevent DVT.  Patient benefited maximally from hospital stay and there were no complications.    Recent vital signs:  Patient Vitals for the past 24 hrs:  BP Temp Temp src Pulse Resp SpO2 Height Weight  09/05/14 0612 116/73 mmHg 98.6 F (37 C) Oral 69 17 98 % - -  09/05/14 0339 - - - - 18 99 % - -  09/05/14 0154 123/61 mmHg 98.6 F (37 C) Oral 81 17 96 % - -  09/05/14 0000 - - - - 17 96 % - -  09/04/14 2000 - - - - 18 97 % - -  09/04/14 1958 135/72 mmHg 98.7 F (37.1 C) Oral 81 17 97 % - -  09/04/14 1843 118/73 mmHg 97.7 F (36.5 C) - 62 20 100 % - -  09/04/14 1645 126/73 mmHg - - 62 20 97 % - -  09/04/14 1628 129/88 mmHg 97.5 F (36.4 C) - - - 100 % - -  09/04/14 1627 - - - 63 21 100 % - -  09/04/14 1623 - - - 60 22 100 % - -  09/04/14 1615 - - - 61 16 100 % - -  09/04/14 1600 134/76 mmHg - - 66 16 99 % - -  09/04/14 1545 132/87 mmHg - -  62 20 97 % - -  09/04/14 1530 128/88 mmHg - - 63 21 98 % - -  09/04/14 1515 116/80 mmHg - - 61 17 98 % - -  09/04/14 1500 - - - 53 12 100 % - -  09/04/14 1449 128/85 mmHg - - 52 15 99 % - -  09/04/14 1445 - - - 52 13 98 % - -  09/04/14 1435 139/92 mmHg - - 53 16 100 % - -  09/04/14 1430 - - - 50 12 100 % - -  09/04/14 1418 - - - 51 15 99 % - -  09/04/14 1417 155/90 mmHg - - 49 10 98 % - -  09/04/14 1402 138/82 mmHg - - 50 15 97 % - -  09/04/14 1400 138/82 mmHg 96.9 F (36.1 C) - 54 16 99 % - -  09/04/14 1127 146/78 mmHg 98 F (36.7 C) Oral 66 20 98 % 6\' 3"  (1.905 m) 100.245 kg (221 lb)     Recent laboratory studies:   Recent Labs  09/05/14 0505  WBC 5.4  HGB 10.4*  HCT 30.8*  PLT PENDING  NA 136*  K 4.5  CL 101  CO2 25  BUN 17  CREATININE 1.20  GLUCOSE 136*  CALCIUM 8.2*     Discharge Medications:     Medication List    STOP taking these medications       aspirin 81 MG  tablet  Replaced by:  aspirin EC 325 MG tablet     NAPROXEN DR 500 MG EC tablet  Generic drug:  naproxen      TAKE these medications       aspirin EC 325 MG tablet  Take 1 tablet (325 mg total) by mouth daily.     BESIVANCE 0.6 % Susp  Generic drug:  Besifloxacin HCl  Place 1 drop into both eyes 3 (three) times daily.     bisacodyl 5 MG EC tablet  Commonly known as:  DULCOLAX  Take 1 tablet (5 mg total) by mouth daily as needed for moderate constipation.     DUREZOL 0.05 % Emul  Generic drug:  Difluprednate  Place 1 drop into both eyes 3 (three) times daily.     levothyroxine 150 MCG tablet  Commonly known as:  SYNTHROID, LEVOTHROID  Take 1 tablet (150 mcg total) by mouth daily.     methocarbamol 500 MG tablet  Commonly known as:  ROBAXIN  Take 1 tablet (500 mg total) by mouth 4 (four) times daily.     metoprolol 100 MG tablet  Commonly known as:  LOPRESSOR  Take 50 mg by mouth 2 (two) times daily.     ondansetron 4 MG tablet  Commonly known as:  ZOFRAN  Take 1 tablet (4 mg total) by mouth every 8 (eight) hours as needed for nausea or vomiting.     oxyCODONE-acetaminophen 5-325 MG per tablet  Commonly known as:  ROXICET  Take 1-2 tablets by mouth every 4 (four) hours as needed.     PROLENSA 0.07 % Soln  Generic drug:  Bromfenac Sodium  Place 1 drop into both eyes daily.     rosuvastatin 20 MG tablet  Commonly known as:  CRESTOR  Take 20 mg by mouth daily.     tadalafil 20 MG tablet  Commonly known as:  CIALIS  Take 20 mg by mouth daily as needed for erectile dysfunction.        Diagnostic Studies: Dg Chest 2 View  08/26/2014   CLINICAL DATA:  Preop, right knee osteoarthritis.  EXAM: CHEST  2 VIEW  COMPARISON:  06/26/2009.  FINDINGS: Trachea is midline. Heart size normal. Biapical pleural thickening. Mild scarring at the right costophrenic angle. Lungs are otherwise clear. No pleural fluid.  IMPRESSION: No acute findings.   Electronically Signed   By:  Lorin Picket M.D.   On: 08/26/2014 09:46    Disposition:   Discharge Instructions   CPM    Complete by:  As directed   Continuous passive motion machine (CPM):      Use the CPM from 0- to 60 for 6 hours per day.      You may increase by 10 per day.  You may break it up into 2 or 3 sessions per day.      Use CPM for 2-3 weeks or until you are told to stop.     Call MD / Call 911    Complete by:  As directed   If you experience chest pain or shortness of breath, CALL 911 and be transported to the hospital emergency room.  If you develope a fever above 101 F, pus (white drainage) or increased drainage or redness at the wound, or calf pain, call your surgeon's office.     Change dressing    Complete by:  As directed   Change dressing on Saturday, then change the dressing daily with sterile 4 x 4 inch gauze dressing and apply TED hose.  You may clean the incision with alcohol prior to redressing.     Constipation Prevention    Complete by:  As directed   Drink plenty of fluids.  Prune juice may be helpful.  You may use a stool softener, such as Colace (over the counter) 100 mg twice a day.  Use MiraLax (over the counter) for constipation as needed.     Diet - low sodium heart healthy    Complete by:  As directed      Discharge instructions    Complete by:  As directed   Weight bearing as tolerated.  Take Aspirin 325 mg 1 tab a day for the next 30 days to prevent blood clots.  Change bandage daily starting on Saturday.  May shower on Monday, but do not soak incision.  May apply ice for up to 20 minutes at a time for pain and swelling.  Follow up appointment in two weeks.     Do not put a pillow under the knee. Place it under the heel.    Complete by:  As directed   Place gray foam under operative heel when in bed or in a chair to work on extension     Increase activity slowly as tolerated    Complete by:  As directed      TED hose    Complete by:  As directed   Use stockings (TED hose)  for 2 weeks on both leg(s).  You may remove them at night for sleeping.           Follow-up Information   Follow up with [facility] F, MD. Schedule an appointment as soon as possible for a visit in 2 weeks.   Specialty:  Orthopedic Surgery   Contact information:   [place] [address] [phone]        Signed: Larae Grooms 09/05/2014, 7:00 AM

## 2014-09-04 NOTE — Progress Notes (Signed)
Orthopedic Tech Progress Note Patient Details:  [name] [birth year] [id number] Applied CPM to RLE.  Applied OHF with trapeze to pt.'s bed.  Left Bone Foam with pt.'s nurse. CPM Right Knee CPM Right Knee: On Right Knee Flexion (Degrees): 90 Right Knee Extension (Degrees): 0   Darrol Poke 09/04/2014, 3:19 PM

## 2014-09-04 NOTE — Interval H&P Note (Signed)
History and Physical Interval Note:  09/04/2014 8:06 AM  [name]  has presented today for surgery, with the diagnosis of djd right knee, djd left knee  The various methods of treatment have been discussed with the patient and family. After consideration of risks, benefits and other options for treatment, the patient has consented to  Procedure(s): RIGHT TOTAL KNEE ARTHROPLASTY (Right) as a surgical intervention .  The patient's history has been reviewed, patient examined, no change in status, stable for surgery.  I have reviewed the patient's chart and labs.  Questions were answered to the patient's satisfaction.     Raylyn Carton F

## 2014-09-04 NOTE — Transfer of Care (Signed)
Immediate Anesthesia Transfer of Care Note  Patient: [name]  Procedure(s) Performed: Procedure(s): Intra-articular injection Left knee, Excision of Chronic Pre-patella Bursa Right Knee , Right Total Knee Arthroplasty (Bilateral)  Patient Location: PACU  Anesthesia Type:Spinal  Level of Consciousness: awake and alert   Airway & Oxygen Therapy: Patient Spontanous Breathing  Post-op Assessment: Report given to PACU RN, Post -op Vital signs reviewed and stable and Patient moving all extremities, just starting to wiggle toes  Post vital signs: Reviewed and stable  Complications: No apparent anesthesia complications

## 2014-09-04 NOTE — Anesthesia Postprocedure Evaluation (Signed)
  Anesthesia Post-op Note  Patient: [name]  Procedure(s) Performed: Procedure(s): Intra-articular injection Left knee, Excision of Chronic Pre-patella Bursa Right Knee , Right Total Knee Arthroplasty (Bilateral)  Patient Location: PACU  Anesthesia Type: Spinal   Level of Consciousness: awake, alert  and oriented  Airway and Oxygen Therapy: Patient Spontanous Breathing  Post-op Pain: mild  Post-op Assessment: Post-op Vital signs reviewed  Post-op Vital Signs: Reviewed  Last Vitals:  Filed Vitals:   09/04/14 1418  BP:   Pulse: 51  Temp:   Resp: 15    Complications: No apparent anesthesia complications

## 2014-09-04 NOTE — Progress Notes (Signed)
Utilization review completed.  

## 2014-09-04 NOTE — Discharge Instructions (Signed)
Total Knee Replacement, Care After Refer to this sheet in the next few weeks. These instructions provide you with information on caring for yourself after your procedure. Your health care provider also may give you specific instructions. Your treatment has been planned according to the most current medical practices, but problems sometimes occur. Call your health care provider if you have any problems or questions after your procedure. HOME CARE INSTRUCTIONS   Weight bearing as tolerated.  Take Aspirin 325 mg 1 tab a day for the next 30 days to prevent blood clots.  Change bandage daily starting on Saturday.  May shower on Monday, but do not soak incision.  May apply ice for up to 20 minutes at a time for pain and swelling.  Follow up appointment in two weeks.   See a physical therapist as directed by your health care provider.  Take medicines only as directed by your health care provider.  Avoid lifting or driving until you are instructed otherwise.  If you have been sent home with a continuous passive motion machine, use it as directed by your health care provider. SEEK MEDICAL CARE IF:  You have difficulty breathing.  You have drainage, redness, swelling, or pain at your incision site.  You have a bad smell coming from your incision site.  You have persistent bleeding from your incision site.  Your incision breaks open after sutures (stitches) or staples have been removed.  You have a fever. SEEK IMMEDIATE MEDICAL CARE IF:   You have a rash.  You have pain or swelling in your calf or thigh.  You have shortness of breath or chest pain.  Your range of motion in your knee is decreasing rather than increasing. MAKE SURE YOU:   Understand these instructions.  Will watch your condition.  Will get help right away if you are not doing well or get worse. Document Released: 05/28/2005 Document Revised: 03/25/2014 Document Reviewed: 12/28/2011 [facility] Patient Information 2015  [place], [place]. This information is not intended to replace advice given to you by your health care provider. Make sure you discuss any questions you have with your health care provider.

## 2014-09-04 NOTE — Progress Notes (Signed)
Orthopedic Tech Progress Note Patient Details:  [name] 12-14-1945 [id number]  Ortho Devices Ortho Device/Splint Location: footsie roll Ortho Device/Splint Interventions: Ordered;Application   Braulio Bosch 09/04/2014, 6:43 PM

## 2014-09-04 NOTE — Progress Notes (Signed)
Orthopedic Tech Progress Note Patient Details:  [name] 09-14-1946 [id number] On cpm at 7:30 pm 0-60 RLE Patient ID: [name], male   DOB: Mar 08, 1946, 68 y.o.   MRN: [id number]   Braulio Bosch 09/04/2014, 7:38 PM

## 2014-09-04 NOTE — Anesthesia Procedure Notes (Addendum)
Spinal  Patient location during procedure: OR Start time: 09/04/2014 11:50 AM End time: 09/04/2014 11:55 AM Staffing Anesthesiologist: Suzette Battiest E Performed by: anesthesiologist  Preanesthetic Checklist Completed: patient identified, site marked, surgical consent, pre-op evaluation, timeout performed, IV checked, risks and benefits discussed and monitors and equipment checked Spinal Block Patient position: sitting Prep: Betadine Patient monitoring: heart rate, continuous pulse ox, blood pressure and cardiac monitor Approach: midline Location: L4-5 Injection technique: single-shot Needle Needle type: Whitacre and Introducer  Needle gauge: 25 G Needle length: 9 cm Additional Notes Expiration date of kit checked and confirmed. Patient tolerated procedure well, without complications. Aspiration CSF before and after injection 2cc's 0.75% bupivicaine.

## 2014-09-04 NOTE — Anesthesia Preprocedure Evaluation (Addendum)
Anesthesia Evaluation  Patient identified by MRN, date of birth, ID band Patient awake    Reviewed: Allergy & Precautions, H&P , NPO status , Patient's Chart, lab work & pertinent test results  Airway Mallampati: II TM Distance: >3 FB Neck ROM: Full    Dental  (+) Teeth Intact, Dental Advisory Given   Pulmonary neg pulmonary ROS,  breath sounds clear to auscultation        Cardiovascular hypertension, + CAD and + CABG + dysrhythmias (LBBB) Rhythm:Regular Rate:Normal     Neuro/Psych negative neurological ROS  negative psych ROS   GI/Hepatic negative GI ROS, Neg liver ROS,   Endo/Other  Hypothyroidism   Renal/GU negative Renal ROS     Musculoskeletal negative musculoskeletal ROS (+)   Abdominal   Peds  Hematology negative hematology ROS (+)   Anesthesia Other Findings   Reproductive/Obstetrics                         Anesthesia Physical Anesthesia Plan  ASA: III  Anesthesia Plan: Spinal   Post-op Pain Management:    Induction: Intravenous  Airway Management Planned: Simple Face Mask  Additional Equipment:   Intra-op Plan:   Post-operative Plan:   Informed Consent: I have reviewed the patients History and Physical, chart, labs and discussed the procedure including the risks, benefits and alternatives for the proposed anesthesia with the patient or authorized representative who has indicated his/her understanding and acceptance.   Dental advisory given  Plan Discussed with: CRNA and Surgeon  Anesthesia Plan Comments:        Anesthesia Quick Evaluation

## 2014-09-05 LAB — CBC
HEMATOCRIT: 30.8 % — AB (ref 39.0–52.0)
Hemoglobin: 10.4 g/dL — ABNORMAL LOW (ref 13.0–17.0)
MCH: 29.5 pg (ref 26.0–34.0)
MCHC: 33.8 g/dL (ref 30.0–36.0)
MCV: 87.3 fL (ref 78.0–100.0)
Platelets: 119 10*3/uL — ABNORMAL LOW (ref 150–400)
RBC: 3.53 MIL/uL — AB (ref 4.22–5.81)
RDW: 13.3 % (ref 11.5–15.5)
WBC: 5.4 10*3/uL (ref 4.0–10.5)

## 2014-09-05 LAB — BASIC METABOLIC PANEL
Anion gap: 10 (ref 5–15)
BUN: 17 mg/dL (ref 6–23)
CHLORIDE: 101 meq/L (ref 96–112)
CO2: 25 mEq/L (ref 19–32)
Calcium: 8.2 mg/dL — ABNORMAL LOW (ref 8.4–10.5)
Creatinine, Ser: 1.2 mg/dL (ref 0.50–1.35)
GFR calc non Af Amer: 62 mL/min — ABNORMAL LOW (ref >90)
GFR, EST AFRICAN AMERICAN: 72 mL/min — AB (ref >90)
GLUCOSE: 136 mg/dL — AB (ref 70–99)
Potassium: 4.5 mEq/L (ref 3.7–5.3)
SODIUM: 136 meq/L — AB (ref 137–147)

## 2014-09-05 NOTE — Care Management Note (Signed)
CARE MANAGEMENT NOTE 09/05/2014  Patient:  JOSEJUAN, HOAGLIN   Account Number:  [id number]  Date Initiated:  09/05/2014  Documentation initiated by:  Ricki Miller  Subjective/Objective Assessment:   68 yr old male admitted with DJD of right knee, s/p right total knee arthroplasty.     Action/Plan:   Case manager spoke with patient concerning home health and DME needs. Wife states they want to change [facility] agencies. Referral called to Sonia Side Amish with [facility].   Anticipated DC Date:  09/05/2014   Anticipated DC Plan:  [employer]  CM consult      [facility] Choice  HOME HEALTH  DURABLE MEDICAL EQUIPMENT   Choice offered to / List presented to:  C-1 Patient   DME arranged  3-N-1  [place]  CPM      DME agency  [employer]     [place] arranged  HH-2 PT      [place] agency  [facility]   Status of service:  Completed, signed off Medicare Important Message given?   (If response is "NO", the following Medicare IM given date fields will be blank) Date Medicare IM given:   Medicare IM given by:   Date Additional Medicare IM given:   Additional Medicare IM given by:    Discharge Disposition:  [place]  Per UR Regulation:  Reviewed for med. necessity/level of care/duration of stay

## 2014-09-05 NOTE — Progress Notes (Signed)
Physical Therapy Treatment Patient Details Name: [name] MRN: [id number] DOB: 1946/04/21 Today's Date: 09/05/2014    History of Present Illness 68 y.o. male s/p Intra-articular injection Left knee, Excision of Chronic Pre-patella Bursa Right Knee , Right Total Knee Arthroplasty. Hx of CAD, LBBB, and syncope.    PT Comments    Patient is progressing well towards physical therapy goals, ambulating up to 120 feet with supervision while using a rolling walker. All therapeutic exercises, precautions, and safety with mobility has been reviewed and pt reports he feels confident in his ability to return home safely. Feel pt is adequate for d/c from a mobility standpoint when medically ready.   Follow Up Recommendations  Home health PT;Supervision for mobility/OOB     Equipment Recommendations  Rolling walker with 5" wheels    Recommendations for Other Services OT consult     Precautions / Restrictions Precautions Precautions: Knee Precaution Comments: Reviewed knee precautions Required Braces or Orthoses: Knee Immobilizer - Right Restrictions Weight Bearing Restrictions: Yes RLE Weight Bearing: Weight bearing as tolerated    Mobility  Bed Mobility                  Transfers Overall transfer level: Needs assistance Equipment used: Rolling walker (2 wheeled) Transfers: Sit to/from Stand Sit to Stand: Min guard;Supervision;Min assist         General transfer comment: Supervision for safety. correctly places hands on stable surface to rise. Decreased control with descent. No loss of balance upon standing. Performed from recliner  Ambulation/Gait Ambulation/Gait assistance: Supervision Ambulation Distance (Feet): 120 Feet Assistive device: Rolling walker (2 wheeled) Gait Pattern/deviations: Step-to pattern;Step-through pattern;Decreased step length - left;Decreased stance time - right;Antalgic;Trunk flexed   Gait velocity interpretation: Below normal speed for  age/gender General Gait Details: Intermittent cues for upright posture and walker placement closer to base of support. No loss of balance or instances of knee buckling with knee immobilizer in place. Required one standing rest break during bout.   Stairs            Wheelchair Mobility    Modified Rankin (Stroke Patients Only)       Balance                                    Cognition Arousal/Alertness: Awake/alert Behavior During Therapy: WFL for tasks assessed/performed Overall Cognitive Status: Within Functional Limits for tasks assessed                      Exercises Total Joint Exercises Ankle Circles/Pumps: AROM;Both;Seated;5 reps Quad Sets: AROM;Both;Seated;5 reps Short Arc Quad: Right;AAROM;10 reps;Seated Heel Slides: AROM;Right;10 reps;Seated    General Comments General comments (skin integrity, edema, etc.): Reviewed precautions, use of knee immobilizer, and footsie roll.      Pertinent Vitals/Pain Pain Assessment: 0-10 Pain Score:  (No value given "hurts") Pain Location: Rt knee Pain Intervention(s): Monitored during session;Premedicated before session;Repositioned    Home Living Family/patient expects to be discharged to:: Private residence Living Arrangements: Spouse/significant other Available Help at Discharge: Family;Available 24 hours/day Type of Home: House Home Access: Stairs to enter Entrance Stairs-Rails: None Home Layout: One level Home Equipment: Bedside commode;Cane - single point      Prior Function Level of Independence: Independent          PT Goals (current goals can now be found in the care plan section) Acute Rehab PT Goals Patient  Stated Goal: Go home today PT Goal Formulation: With patient Time For Goal Achievement: 09/12/14 Potential to Achieve Goals: Good Progress towards PT goals: Progressing toward goals    Frequency  7X/week    PT Plan Current plan remains appropriate    Co-evaluation              End of Session Equipment Utilized During Treatment: Gait belt;Right knee immobilizer Activity Tolerance: Patient tolerated treatment well Patient left: in chair;with call bell/phone within reach;with family/visitor present     Time: 1550-1615 PT Time Calculation (min): 25 min  Charges:  $Gait Training: 8-22 mins $Therapeutic Activity: 8-22 mins                    G Codes:      [employer], [place]  [name] 09/05/2014, 4:35 PM

## 2014-09-05 NOTE — Progress Notes (Signed)
PT Cancellation Note  Patient Details Name: MOISE FRIDAY MRN: [id number] DOB: 06-07-1946   Cancelled Treatment:    Reason Eval/Treat Not Completed: Other (comment) (Request later time) Pts wife adamant his therapy be held until exactly 30-40 minutes after his next scheduled dose of pain medication which would be approximately 1535. Will follow up as time allows.  [place], Edgar     [name] 09/05/2014, 1:54 PM

## 2014-09-05 NOTE — Progress Notes (Signed)
Subjective: 1 Day Post-Op Procedure(s) (LRB): Intra-articular injection Left knee, Excision of Chronic Pre-patella Bursa Right Knee , Right Total Knee Arthroplasty (Bilateral) Patient reports pain as 2 on 0-10 scale.  No nausea/vomiting, lightheadedness/dizziness.  Positive flatus but no bm.  Tolerating diet.  Anticipating being d/c today.  Objective: Vital signs in last 24 hours: Temp:  [96.9 F (36.1 C)-98.7 F (37.1 C)] 98.6 F (37 C) (10/15 0612) Pulse Rate:  [49-81] 69 (10/15 0612) Resp:  [10-22] 17 (10/15 0612) BP: (116-155)/(61-92) 116/73 mmHg (10/15 0612) SpO2:  [96 %-100 %] 98 % (10/15 0612) Weight:  [100.245 kg (221 lb)] 100.245 kg (221 lb) (10/14 1127)  Intake/Output from previous day: 10/14 0701 - 10/15 0700 In: 8675 [P.O.:120; I.V.:1300] Out: 305 [Drains:305] Intake/Output this shift: Total I/O In: 120 [P.O.:120] Out: 40 [Drains:40]   Recent Labs  09/05/14 0505  HGB 10.4*    Recent Labs  09/05/14 0505  WBC 5.4  RBC 3.53*  HCT 30.8*  PLT PENDING    Recent Labs  09/05/14 0505  NA 136*  K 4.5  CL 101  CO2 25  BUN 17  CREATININE 1.20  GLUCOSE 136*  CALCIUM 8.2*   No results found for this basename: LABPT, INR,  in the last 72 hours  Neurologically intact Neurovascular intact Sensation intact distally Intact pulses distally Dorsiflexion/Plantar flexion intact Incision: dressing C/D/I No cellulitis present Compartment soft hemovac drain pulled by me today Dressing changed by me today Negative homans bilaterally  Assessment/Plan: 1 Day Post-Op Procedure(s) (LRB): Intra-articular injection Left knee, Excision of Chronic Pre-patella Bursa Right Knee , Right Total Knee Arthroplasty (Bilateral) Advance diet Up with therapy Discharge home with home health WBAT RLE ABLA-patient asymptomatic but we will continue to follow  [place], M. LINDSEY 09/05/2014, 6:58 AM

## 2014-09-05 NOTE — Op Note (Signed)
NAME:  [name], [name] NO.:  [id number]  MEDICAL RECORD NO.:  [id number]  LOCATION:  [id number]                        FACILITY:  [facility]  PHYSICIAN:  Ninetta Lights, M.D. DATE OF BIRTH:  07-11-1946  DATE OF PROCEDURE:  09/05/2014 DATE OF DISCHARGE:                              OPERATIVE REPORT   PREOPERATIVE DIAGNOSES: 1. Right knee end-stage degenerative arthritis with varus alignment,     some bone loss medial compartment. 2. Moderate tricompartmental degenerative arthritis, left knee.  POSTOPERATIVE DIAGNOSES: 1. Right knee end-stage degenerative arthritis with varus alignment,     some bone loss medial compartment. 2. Moderate tricompartmental degenerative arthritis, left knee and in     addition a chronic swollen noninfected large prepatellar bursitis     in the right knee.  PROCEDURE: 1. Right knee modified minimally invasive total knee replacement     utilizing Stryker triathlon prosthesis.  Soft tissue balancing.     Cemented pegged posterior stabilized femoral component #7.     Cemented #7 tibial component, 9 mm posterior stabilized     polyethylene insert.  Cemented resurfacing 38-mm patellar     component. 2. Resection of prepatellar bursa, right knee. 3. Intra-articular injection of left knee with Depo-Medrol and     Marcaine.  SURGEON:  Ninetta Lights, M.D.  ASSISTANT:  Doran Stabler, PA-C present throughout the entire case and necessary for timely completion of procedure.  ANESTHESIA:  General.  BLOOD LOSS:  Minimal.  SPECIMENS:  None.  CULTURES:  None.  COMPLICATIONS:  None.  DRESSINGS:  Soft compressive knee immobilizer on the right.  DRAINS:  Hemovac x1.  TOURNIQUET TIME:  One hour.  DESCRIPTION OF PROCEDURE:  The patient was brought to the operating room, placed on the operating table in supine position.  After adequate anesthesia had been obtained under sterile technique, intra-articular injection, left knee with  Depo-Medrol and Marcaine.  Attention turned to the right.  Tourniquet applied and prepped and draped in usual sterile fashion.  Exsanguinated with elevation of Esmarch.  Tourniquet inflated to 300 mmHg.  Straight incision above the patella down to tibial tubercle.  Skin and subcutaneous tissue divided.  A large chronically swollen prepatellar bursa excised in its entirety.  Medial arthrotomy, vastus splitting, and preserving quad tendon.  Knee exposed.  Medial capsule release.  Distal femur intramedullary guide flexible rod.  An 8 mm resection, 5 degrees of valgus.  Using epicondylar axis, the femur was sized, cut, and fitted for a posterior stabilized pegged #7 component.  Proximal tibial resection below the defect medially.  A 3- degree posterior slope cut.  Extramedullary guide.  Size #7 component. Debris cleared throughout the knee.  Patella exposed.  Posterior 10 mm removed.  Drilled, sized, and fitted for a 38-mm component.  Trials put in and placed with the 9 mm insert.  Very pleased with full extension, full flexion, nicely balanced knee with good stability throughout. Excellent patellofemoral tracking.  Tibia was marked for rotation and hand reamed.  All trials removed.  Copious irrigation with a pulse irrigating device.  Cement prepared, placed on all components, firmly seated.  Polyethylene attached to the tibia and knee reduced.  Patella  held with a clamp.  Once the cement hardened, the knee was irrigated again.  Soft tissue was injected with Exparel.  Hemovac placed. Arthrotomy closed with #1 Vicryl with the subcutaneous and subcuticular closure.  Margins were injected with Marcaine.  Sterile compressive dressing applied.  Tourniquet deflated and removed.  Knee immobilizer applied.  Anesthesia reversed.  Brought to the recovery room.  Tolerated the surgery well.  No complications.     Ninetta Lights, M.D.     DFM/MEDQ  D:  09/05/2014  T:  09/05/2014  Job:  [id number]

## 2014-09-05 NOTE — Evaluation (Signed)
Physical Therapy Evaluation Patient Details Name: [name] MRN: [id number] DOB: 08/04/1946 Today's Date: 09/05/2014   History of Present Illness  68 y.o. male s/p Intra-articular injection Left knee, Excision of Chronic Pre-patella Bursa Right Knee , Right Total Knee Arthroplasty. Hx of CAD, LBBB, and syncope.  Clinical Impression  Pt is s/p right TKA resulting in the deficits listed below (see PT Problem List). Ambulates up to 90 feet with a rolling walker at min guard assist and safely completed stair training this AM. Reviewed precautions and therapeutic exercises with pt. He is eager to return home today and will have 24 hour care from his wife. Feel he is adequate for d/c from a mobility standpoint when medically ready. Pt will benefit from skilled PT at home with HHPT to increase their independence and safety with mobility to allow discharge to the venue listed below.     Follow Up Recommendations Home health PT;Supervision for mobility/OOB    Equipment Recommendations  Rolling walker with 5" wheels    Recommendations for Other Services OT consult     Precautions / Restrictions Precautions Precautions: Knee Precaution Booklet Issued: Yes (comment) Precaution Comments: Reviewed knee precautions Required Braces or Orthoses: Knee Immobilizer - Right Restrictions Weight Bearing Restrictions: Yes RLE Weight Bearing: Weight bearing as tolerated      Mobility  Bed Mobility Overal bed mobility: Needs Assistance Bed Mobility: Supine to Sit     Supine to sit: Supervision     General bed mobility comments: Supervision for safety. VC for technique. Requires extra time.  Transfers Overall transfer level: Needs assistance Equipment used: Rolling walker (2 wheeled) Transfers: Sit to/from Stand Sit to Stand: Min guard         General transfer comment: Min guard for safety. VC for hand placement. Performed from lowest bed setting and recliner. Good stability upon  standing.  Ambulation/Gait Ambulation/Gait assistance: Min guard Ambulation Distance (Feet): 90 Feet Assistive device: Rolling walker (2 wheeled) Gait Pattern/deviations: Step-to pattern;Decreased step length - left;Decreased stance time - right;Antalgic;Trunk flexed   Gait velocity interpretation: Below normal speed for age/gender General Gait Details: Educated on safe DME use with a rolling walker. VC for sequencing, and to extend right knee in stance phase for quad activation. No loss of balance or buckling noted during bout while wearing knee immobilizer.  Stairs Stairs: Yes Stairs assistance: Min guard Stair Management: No rails;Step to pattern;Forwards;With walker Number of Stairs: 1 General stair comments: Demonstrated safe stair navigation prior to pt practicing. He was able to teach back correct sequence and performed this task with min guard for safety.  Wheelchair Mobility    Modified Rankin (Stroke Patients Only)       Balance Overall balance assessment: Needs assistance Sitting-balance support: No upper extremity supported;Feet supported Sitting balance-Leahy Scale: Good     Standing balance support: No upper extremity supported Standing balance-Leahy Scale: Fair                               Pertinent Vitals/Pain Pain Assessment: 0-10 Pain Score: 3  Pain Location: Rt knee Pain Descriptors / Indicators: Dull;Constant Pain Intervention(s): Monitored during session;Premedicated before session;Repositioned    Home Living Family/patient expects to be discharged to:: Private residence Living Arrangements: Spouse/significant other Available Help at Discharge: Family;Available 24 hours/day Type of Home: House Home Access: Stairs to enter Entrance Stairs-Rails: None Entrance Stairs-Number of Steps: 1 Home Layout: One level Home Equipment: Other (comment);Cane - single  point (3 in 1)      Prior Function Level of Independence: Independent                Hand Dominance   Dominant Hand: Right    Extremity/Trunk Assessment   Upper Extremity Assessment: Defer to OT evaluation           Lower Extremity Assessment: RLE deficits/detail RLE Deficits / Details: Decreased strength and ROM as expected post-op       Communication   Communication: No difficulties  Cognition Arousal/Alertness: Awake/alert Behavior During Therapy: WFL for tasks assessed/performed Overall Cognitive Status: Within Functional Limits for tasks assessed                      General Comments General comments (skin integrity, edema, etc.): IV out of hand during ambulation as pt gripped RW. RN notified and aware.    Exercises Total Joint Exercises Ankle Circles/Pumps: AROM;Both;10 reps;Seated Quad Sets: AROM;Both;10 reps;Seated      Assessment/Plan    PT Assessment Patient needs continued PT services  PT Diagnosis Difficulty walking;Abnormality of gait;Acute pain   PT Problem List Decreased strength;Decreased range of motion;Decreased activity tolerance;Decreased balance;Decreased mobility;Decreased knowledge of use of DME;Decreased knowledge of precautions;Pain  PT Treatment Interventions DME instruction;Gait training;Stair training;Functional mobility training;Therapeutic activities;Therapeutic exercise;Balance training;Neuromuscular re-education;Patient/family education;Modalities   PT Goals (Current goals can be found in the Care Plan section) Acute Rehab PT Goals Patient Stated Goal: Go home today PT Goal Formulation: With patient Time For Goal Achievement: 09/12/14 Potential to Achieve Goals: Good    Frequency 7X/week   Barriers to discharge        Co-evaluation               End of Session Equipment Utilized During Treatment: Gait belt;Right knee immobilizer Activity Tolerance: Patient tolerated treatment well Patient left: in chair;with call bell/phone within reach Nurse Communication: Mobility  status;Other (comment) (IV out )         Time: 0913-1009 PT Time Calculation (min): 56 min   Charges:   PT Evaluation $Initial PT Evaluation Tier I: 1 Procedure PT Treatments $Gait Training: 8-22 mins $Therapeutic Activity: 23-37 mins   PT G Codes:         Elayne Snare, [place]  [name] 09/05/2014, 10:46 AM

## 2014-09-05 NOTE — Evaluation (Signed)
Occupational Therapy Evaluation and Discharge Patient Details Name: [name] MRN: [id number] DOB: 1946/06/03 Today's Date: 09/05/2014    History of Present Illness 67 y.o. male s/p Intra-articular injection Left knee, Excision of Chronic Pre-patella Bursa Right Knee , Right Total Knee Arthroplasty. Hx of CAD, LBBB, and syncope.   Clinical Impression   This 68 yo male admitted and underwent above presents to acute OT with all education completed with he and his wife. They do not have any further questions about BADLs. Acute OT will sign off.    Follow Up Recommendations  No OT follow up    Equipment Recommendations  None recommended by OT       Precautions / Restrictions Precautions Precautions: Knee Required Braces or Orthoses: Knee Immobilizer - Right Restrictions Weight Bearing Restrictions: No RLE Weight Bearing: Weight bearing as tolerated              ADL                                         General ADL Comments: Pt and wife aware of sequencing for getting dressed that is the most energy efficient. I demonstrated to pt and wife how patient will get in and out of tub to tub seat with back and then 3n1 once he is cleared to take a shower.                     Hand Dominance Right   Extremity/Trunk Assessment Upper Extremity Assessment Upper Extremity Assessment: Overall WFL for tasks assessed           Communication Communication Communication: No difficulties   Cognition Arousal/Alertness: Awake/alert Behavior During Therapy: WFL for tasks assessed/performed Overall Cognitive Status: Within Functional Limits for tasks assessed                                Home Living Family/patient expects to be discharged to:: Private residence Living Arrangements: Spouse/significant other Available Help at Discharge: Family;Available 24 hours/day Type of Home: House Home Access: Stairs to enter [employer]  of Steps: 1 Entrance Stairs-Rails: None Home Layout: One level     Bathroom Shower/Tub: Tub/shower unit;Curtain Shower/tub characteristics: Architectural technologist: Standard     Home Equipment: Bedside commode;Cane - single point          Prior Functioning/Environment Level of Independence: Independent             OT Diagnosis: Generalized weakness         OT Goals(Current goals can be found in the care plan section) Acute Rehab OT Goals Patient Stated Goal: home today  OT Frequency:                End of Session    Activity Tolerance: Patient tolerated treatment well Patient left: in chair   Time: 5625-6389 OT Time Calculation (min): 29 min Charges:  OT General Charges $OT Visit: 1 Procedure OT Evaluation $Initial OT Evaluation Tier I: 1 Procedure OT Treatments $Self Care/Home Management : 23-37 mins  [name] [phone] 09/05/2014, 3:41 PM

## 2014-09-10 ENCOUNTER — Encounter (HOSPITAL_COMMUNITY): Payer: Self-pay | Admitting: Orthopedic Surgery

## 2014-11-25 ENCOUNTER — Other Ambulatory Visit (INDEPENDENT_AMBULATORY_CARE_PROVIDER_SITE_OTHER): Payer: 59 | Admitting: *Deleted

## 2014-11-25 DIAGNOSIS — E7800 Pure hypercholesterolemia, unspecified: Secondary | ICD-10-CM

## 2014-11-25 DIAGNOSIS — Z79899 Other long term (current) drug therapy: Secondary | ICD-10-CM

## 2014-11-25 DIAGNOSIS — E039 Hypothyroidism, unspecified: Secondary | ICD-10-CM

## 2014-11-25 LAB — CBC WITH DIFFERENTIAL/PLATELET
BASOS ABS: 0 10*3/uL (ref 0.0–0.1)
Basophils Relative: 0.7 % (ref 0.0–3.0)
EOS PCT: 3.7 % (ref 0.0–5.0)
Eosinophils Absolute: 0.2 10*3/uL (ref 0.0–0.7)
HCT: 40.5 % (ref 39.0–52.0)
HEMOGLOBIN: 13.3 g/dL (ref 13.0–17.0)
LYMPHS PCT: 32.8 % (ref 12.0–46.0)
Lymphs Abs: 1.7 10*3/uL (ref 0.7–4.0)
MCHC: 32.8 g/dL (ref 30.0–36.0)
MCV: 87.5 fl (ref 78.0–100.0)
Monocytes Absolute: 0.7 10*3/uL (ref 0.1–1.0)
Monocytes Relative: 12.9 % — ABNORMAL HIGH (ref 3.0–12.0)
Neutro Abs: 2.6 10*3/uL (ref 1.4–7.7)
Neutrophils Relative %: 49.9 % (ref 43.0–77.0)
PLATELETS: 175 10*3/uL (ref 150.0–400.0)
RBC: 4.63 Mil/uL (ref 4.22–5.81)
RDW: 13.2 % (ref 11.5–15.5)
WBC: 5.2 10*3/uL (ref 4.0–10.5)

## 2014-11-25 LAB — LIPID PANEL
Cholesterol: 138 mg/dL (ref 0–200)
HDL: 51 mg/dL (ref >39.00)
LDL CALC: 75 mg/dL (ref 0–99)
NonHDL: 87
Total CHOL/HDL Ratio: 3
Triglycerides: 61 mg/dL (ref 0.0–149.0)
VLDL: 12.2 mg/dL (ref 0.0–40.0)

## 2014-11-25 LAB — BASIC METABOLIC PANEL
BUN: 16 mg/dL (ref 6–23)
CO2: 23 meq/L (ref 19–32)
Calcium: 9.3 mg/dL (ref 8.4–10.5)
Chloride: 108 mEq/L (ref 96–112)
Creatinine, Ser: 0.8 mg/dL (ref 0.4–1.5)
GFR: 98.52 mL/min (ref >60.00)
GLUCOSE: 106 mg/dL — AB (ref 70–99)
POTASSIUM: 4.2 meq/L (ref 3.5–5.1)
SODIUM: 141 meq/L (ref 135–145)

## 2014-11-25 LAB — HEPATIC FUNCTION PANEL
ALT: 15 U/L (ref 0–53)
AST: 18 U/L (ref 0–37)
Albumin: 4 g/dL (ref 3.5–5.2)
Alkaline Phosphatase: 56 U/L (ref 39–117)
BILIRUBIN TOTAL: 0.7 mg/dL (ref 0.2–1.2)
Bilirubin, Direct: 0.1 mg/dL (ref 0.0–0.3)
Total Protein: 7.2 g/dL (ref 6.0–8.3)

## 2014-11-25 LAB — TSH: TSH: 0.17 u[IU]/mL — AB (ref 0.35–4.50)

## 2014-11-25 NOTE — Progress Notes (Signed)
Quick Note:  Please make copy of labs for patient visit. ______ 

## 2014-11-29 ENCOUNTER — Encounter: Payer: Self-pay | Admitting: Cardiology

## 2014-11-29 ENCOUNTER — Ambulatory Visit (INDEPENDENT_AMBULATORY_CARE_PROVIDER_SITE_OTHER): Payer: 59 | Admitting: Cardiology

## 2014-11-29 VITALS — BP 102/72 | HR 74 | Ht 75.0 in | Wt 218.0 lb

## 2014-11-29 DIAGNOSIS — E7800 Pure hypercholesterolemia, unspecified: Secondary | ICD-10-CM

## 2014-11-29 DIAGNOSIS — E039 Hypothyroidism, unspecified: Secondary | ICD-10-CM

## 2014-11-29 NOTE — Progress Notes (Signed)
[name] Date of Birth:  [birth year] [facility] [address] [place], [place]  [address] [phone]  Fax   [phone]  HPI: This pleasant 69 year old gentleman is seen for a six-month followup office visit. He has a history of ischemic heart disease and is status post coronary artery bypass graft surgery. He has a known left bundle branch block. He has had a previous EP study which was normal as part of an evaluation for syncope. He has a history of hypothyroidism and hyperlipidemia. He has been feeling well.  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  However he had a right total knee replacement on October 14 by Dr. Percell Miller.  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 The patient has known left bundle branch block.  He has not been having any syncopal or dizzy spells. The patient is on Crestor 20 mg daily.  He is not experiencing any myalgias.  Lab work is excellent. The patient has hypothyroidism and is on Synthroid.  [name].  TSH is appropriately suppressed.   Current Outpatient Prescriptions  Medication Sig Dispense Refill  . aspirin EC 325 MG tablet Take 1 tablet (325 mg total) by mouth daily. (Patient taking differently: Take 81 mg by mouth daily. ) 30 tablet 0  . levothyroxine (SYNTHROID, LEVOTHROID) 150 MCG tablet Take 1 tablet (150 mcg total) by mouth daily. 90 tablet 3  . metoprolol (LOPRESSOR) 100 MG tablet Take 50 mg by mouth 2 (two) times daily.    . rosuvastatin (CRESTOR) 20 MG tablet Take 20 mg by mouth daily.    . tadalafil (CIALIS) 20 MG tablet Take 20 mg by mouth daily as needed for erectile dysfunction.     No current facility-administered medications for this visit.    No Known Allergies  Patient Active Problem List   Diagnosis Date Noted  . CAD (coronary artery disease)     Priority: Medium    . DJD (degenerative joint disease) of knee 09/04/2014  . Hypercholesterolemia 05/02/2014  . Elevated blood pressure reading 07/13/2011  . Hypothyroidism 02/24/2011  . Erectile dysfunction 02/24/2011  . LBBB (left bundle branch block)   . Syncope     History  Smoking status  . Never Smoker   Smokeless tobacco  . Never Used    History  Alcohol Use No    Family History  Problem Relation Age of Onset  . Heart attack Father   . Ovarian cancer Mother     Review of Systems: The patient denies any heat or cold intolerance.  No weight gain or weight loss.  The patient denies headaches or blurry vision.  There is no cough or sputum production.  The patient denies dizziness.  There is no hematuria or hematochezia.  The patient denies any muscle aches or arthritis.  The patient denies any rash.  The patient denies frequent falling or instability.  There is no history of depression or anxiety.  All other systems were reviewed and are negative.   Physical Exam: Filed Vitals:   11/29/14 1429  BP: 102/72  Pulse: 74   the general appearance reveals a well-developed well-nourished tanned gentleman in no distress.The head and neck exam reveals pupils equal and reactive.  Extraocular movements are full.  There is no scleral icterus.  The mouth and pharynx are normal.  The  neck is supple.  The carotids reveal no bruits.  The jugular venous pressure is normal.  The  thyroid is not enlarged.  There is no lymphadenopathy.  The chest is clear to percussion and auscultation.  There are no rales or rhonchi.  Expansion of the chest is symmetrical.  The precordium is quiet.  The first heart sound is normal.  The second heart sound is physiologically split.  There is no murmur gallop rub or click.  There is no abnormal lift or heave.  The abdomen is soft and nontender.  The bowel sounds are normal.  The liver and spleen are not enlarged.  There are no abdominal masses.  There are no abdominal bruits.   Extremities reveal good pedal pulses.  There is no phlebitis or edema.  There is no cyanosis or clubbing.  Strength is normal and symmetrical in all extremities.  There is no lateralizing weakness.  There are no sensory deficits.  The skin is warm and dry.  There is no rash.   EKG today shows normal sinus rhythm with left bundle branch block and occasional PVC.  Since last tracing in November 2014, no significant change   Assessment / Plan: 1. ischemic heart disease status post CABG 2. Hypercholesterolemia 3. hypothyroidism on Synthroid 4. left bundle branch block 5.  Osteoarthritis status post successful right total knee replacement in October 2015 6.  Status post successful cataract surgery bilaterally  Plan: Continue current medication.  Recheck in 6 months for office visit lipid panel hepatic function panel basal metabolic panel free T4 and TSH

## 2014-11-29 NOTE — Patient Instructions (Signed)
Your physician recommends that you continue on your current medications as directed. Please refer to the Current Medication list given to you today.  Your physician wants you to follow-up in: 6 months with fasting labs (lp/bmet/hfp/tsh/ft4)  You will receive a reminder letter in the mail two months in advance. If you don't receive a letter, please call our office to schedule the follow-up appointment.

## 2014-12-02 ENCOUNTER — Other Ambulatory Visit: Payer: Self-pay | Admitting: Cardiology

## 2015-06-03 ENCOUNTER — Encounter: Payer: Self-pay | Admitting: Cardiology

## 2015-06-03 NOTE — Telephone Encounter (Signed)
Patient called regarding his wife   This encounter was created in error - please disregard. 

## 2015-06-03 NOTE — Telephone Encounter (Signed)
New message     Pt has a question for [facility].  He would not tell me what he wanted.

## 2015-06-10 ENCOUNTER — Other Ambulatory Visit: Payer: Self-pay | Admitting: Cardiology

## 2015-06-17 ENCOUNTER — Ambulatory Visit (INDEPENDENT_AMBULATORY_CARE_PROVIDER_SITE_OTHER): Payer: 59 | Admitting: Cardiology

## 2015-06-17 ENCOUNTER — Encounter: Payer: Self-pay | Admitting: Cardiology

## 2015-06-17 VITALS — BP 102/66 | HR 86 | Ht 75.0 in | Wt 220.0 lb

## 2015-06-17 DIAGNOSIS — I259 Chronic ischemic heart disease, unspecified: Secondary | ICD-10-CM

## 2015-06-17 DIAGNOSIS — I447 Left bundle-branch block, unspecified: Secondary | ICD-10-CM

## 2015-06-17 DIAGNOSIS — E038 Other specified hypothyroidism: Secondary | ICD-10-CM

## 2015-06-17 DIAGNOSIS — E7800 Pure hypercholesterolemia, unspecified: Secondary | ICD-10-CM

## 2015-06-17 NOTE — Progress Notes (Signed)
Cardiology Office Note   Date:  06/17/2015   ID:  [name], DOB [birth year], MRN [id number]  PCP:  Gerald Battiest, MD  Cardiologist: Gerald Coco MD  No chief complaint on file.     History of Present Illness: [name] is a 69 y.o. male who presents for a scheduled follow-up office visit  This pleasant 69 year old gentleman is seen for a six-month followup office visit. He has a history of ischemic heart disease and is status post coronary artery bypass graft surgery. He has a known left bundle branch block. He has had a previous EP study which was normal as part of an evaluation for syncope. He has a history of hypothyroidism and hyperlipidemia. He has been feeling well. 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 The patient underwent total knee replacement in October 2016.  He has had a good result.  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  Rather he intends to start playing tennis again.  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 He has not been having any syncopal or dizzy spells. The patient is on Crestor 20 mg daily. He is not experiencing any myalgias. Lab work is pending. The patient has hypothyroidism and is on Synthroid. [name]. TSH is appropriately suppressed. The patient has not been having a chest pain or shortness of breath.  No dizziness or syncope or palpitations.  Past Medical History  Diagnosis Date  . CAD (coronary artery disease)     s/p CABG x 5 in 2010; Dr. Darcey Arroyo  . Syncope     Negative EP evaluation  . HTN (hypertension)   . Hyperlipidemia   . ED (erectile dysfunction)   . Hypothyroid   . LBBB (left bundle branch block)   . Cancer 2005    throat    Past Surgical History  Procedure Laterality Date  . Coronary artery bypass graft  06/06/09    x 5  . Cardiac catheterization      . 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 Incisional hernia repair    . Total knee arthroplasty Bilateral 09/04/2014    Procedure: Intra-articular injection Left knee, Excision of Chronic Pre-patella Bursa Right Knee , Right Total Knee Arthroplasty;  Surgeon: Gerald Lights, MD;  Location: [facility];  Service: Orthopedics;  Laterality: Bilateral;     Current Outpatient Prescriptions  Medication Sig Dispense Refill  . aspirin 81 MG tablet Take 81 mg by mouth daily.    . CRESTOR 20 MG tablet Take 1 tablet by mouth  daily 30 tablet 6  . levothyroxine (SYNTHROID, LEVOTHROID) 150 MCG tablet Take 1 tablet (150 mcg total) by mouth daily. 90 tablet 3  . metoprolol (LOPRESSOR) 100 MG tablet TAKE HALF TABLET BY MOUTH TWICE DAILY 180 tablet 3  . tadalafil (CIALIS) 20 MG tablet Take 20 mg by mouth daily as needed for erectile dysfunction.     No current facility-administered medications for this visit.    Allergies:   Review of patient's allergies indicates no known allergies.    Social History:  The patient  reports that he has never smoked. He has never used smokeless tobacco. He reports that he does not drink alcohol or use illicit drugs.   Family History:  The patient's  family history includes Heart attack in his father; Ovarian cancer in his mother.    ROS:  Please see the history of present illness.   Otherwise, review of systems are positive for none.   All other systems are reviewed and negative.    PHYSICAL EXAM: VS:  BP 102/66 mmHg  Pulse 86  Ht 6\' 3"  (1.905 m)  Wt 220 lb (99.791 kg)  BMI 27.50 kg/m2  SpO2 95% , BMI Body mass index is 27.5 kg/(m^2). GEN: Well nourished, well developed, in no acute distress HEENT: normal Neck: no JVD, carotid bruits, or masses Cardiac: RRR; no murmurs, rubs, or gallops,no edema  Respiratory:  clear to auscultation bilaterally, normal work of breathing GI: soft, nontender, nondistended, +  BS MS: no deformity or atrophy Skin: warm and dry, no rash Neuro:  Strength and sensation are intact Psych: euthymic mood, full affect   EKG:  EKG is not ordered today.    Recent Labs: 11/25/2014: ALT 15; BUN 16; Creatinine, Ser 0.8; Hemoglobin 13.3; Platelets 175.0; Potassium 4.2; Sodium 141; TSH 0.17*    Lipid Panel    Component Value Date/Time   CHOL 138 11/25/2014 1127   TRIG 61.0 11/25/2014 1127   HDL 51.00 11/25/2014 1127   CHOLHDL 3 11/25/2014 1127   VLDL 12.2 11/25/2014 1127   LDLCALC 75 11/25/2014 1127      Wt Readings from Last 3 Encounters:  06/17/15 220 lb (99.791 kg)  11/29/14 218 lb (98.884 kg)  09/04/14 221 lb (100.245 kg)        ASSESSMENT AND PLAN:  1. ischemic heart disease status post CABG 2. Hypercholesterolemia 3. hypothyroidism on Synthroid 4. left bundle branch block 5. Osteoarthritis status post successful right total knee replacement in October 2015 6. Status post successful cataract surgery bilaterally  Plan: Continue current medication.  Return soon for fasting lab work including TSH and free T4 Recheck in 6 months for office visit lipid panel hepatic function panel basal metabolic panel free T4 and TSH and EKG. After that he plans to be retired and they will probably be moving to [place] [place].   Current medicines are reviewed at length with the patient today.  The patient does not have concerns regarding medicines.  The following changes have been made:  no change  Labs/ tests ordered today include:   Orders Placed This Encounter  Procedures  . Lipid panel  . Hepatic function panel  . Basic metabolic panel  . TSH  . T4, free        Signed, Gerald Coco MD 06/17/2015 6:32 PM    [facility] [place], [place], [place]  [address] Phone: [phone]; Fax: [phone]

## 2015-06-17 NOTE — Patient Instructions (Addendum)
Medication Instructions:  Your physician recommends that you continue on your current medications as directed. Please refer to the Current Medication list given to you today.  Labwork: Return for fasting labs on 7/28  Testing/Procedures: none  Follow-Up: Your physician wants you to follow-up in: 6 months with fasting labs (lp/bmet/hfp/tsh/ft4)  And ekg  You will receive a reminder letter in the mail two months in advance. If you don't receive a letter, please call our office to schedule the follow-up appointment.

## 2015-06-19 ENCOUNTER — Other Ambulatory Visit (INDEPENDENT_AMBULATORY_CARE_PROVIDER_SITE_OTHER): Payer: 59 | Admitting: *Deleted

## 2015-06-19 DIAGNOSIS — E7800 Pure hypercholesterolemia, unspecified: Secondary | ICD-10-CM

## 2015-06-19 DIAGNOSIS — E039 Hypothyroidism, unspecified: Secondary | ICD-10-CM

## 2015-06-19 LAB — LIPID PANEL
CHOL/HDL RATIO: 3
Cholesterol: 132 mg/dL (ref 0–200)
HDL: 51.3 mg/dL (ref >39.00)
LDL Cholesterol: 64 mg/dL (ref 0–99)
NonHDL: 80.84
TRIGLYCERIDES: 83 mg/dL (ref 0.0–149.0)
VLDL: 16.6 mg/dL (ref 0.0–40.0)

## 2015-06-19 LAB — HEPATIC FUNCTION PANEL
ALBUMIN: 3.8 g/dL (ref 3.5–5.2)
ALT: 16 U/L (ref 0–53)
AST: 17 U/L (ref 0–37)
Alkaline Phosphatase: 58 U/L (ref 39–117)
Bilirubin, Direct: 0.1 mg/dL (ref 0.0–0.3)
Total Bilirubin: 0.5 mg/dL (ref 0.2–1.2)
Total Protein: 6.7 g/dL (ref 6.0–8.3)

## 2015-06-19 LAB — BASIC METABOLIC PANEL
BUN: 16 mg/dL (ref 6–23)
CALCIUM: 9.2 mg/dL (ref 8.4–10.5)
CO2: 27 mEq/L (ref 19–32)
CREATININE: 1.01 mg/dL (ref 0.40–1.50)
Chloride: 104 mEq/L (ref 96–112)
GFR: 78.41 mL/min (ref >60.00)
GLUCOSE: 110 mg/dL — AB (ref 70–99)
POTASSIUM: 3.9 meq/L (ref 3.5–5.1)
Sodium: 139 mEq/L (ref 135–145)

## 2015-06-19 LAB — T4, FREE: FREE T4: 1.37 ng/dL (ref 0.60–1.60)

## 2015-06-19 LAB — TSH: TSH: 0.37 u[IU]/mL (ref 0.35–4.50)

## 2015-06-19 NOTE — Progress Notes (Signed)
Quick Note:  Please report to patient. The recent labs are stable. Continue same medication and careful diet. Thyroid okay. Lipids are good.BS slightly high. ______

## 2015-11-18 ENCOUNTER — Other Ambulatory Visit: Payer: Self-pay | Admitting: Physician Assistant

## 2015-11-18 NOTE — H&P (Signed)
TOTAL KNEE ADMISSION H&P  Patient is being admitted for left total knee arthroplasty.  Subjective:  Chief Complaint:left knee pain.  HPI: [name], 69 y.o. male, has a history of pain and functional disability in the left knee due to arthritis and has failed non-surgical conservative treatments for greater than 12 weeks to includeNSAID's and/or analgesics and corticosteriod injections.  Onset of symptoms was gradual, starting 2 years ago with gradually worsening course since that time. The patient noted no past surgery on the left knee(s).  Patient currently rates pain in the left knee(s) at 3 out of 10 with activity. Patient has worsening of pain with activity and weight bearing.  Patient has evidence of subchondral sclerosis and joint space narrowing by imaging studies. There is no active infection.  Patient Active Problem List   Diagnosis Date Noted  . DJD (degenerative joint disease) of knee 09/04/2014  . Hypercholesterolemia 05/02/2014  . Elevated blood pressure reading 07/13/2011  . Hypothyroidism 02/24/2011  . Erectile dysfunction 02/24/2011  . CAD (coronary artery disease)   . LBBB (left bundle branch block)   . Syncope    Past Medical History  Diagnosis Date  . CAD (coronary artery disease)     s/p CABG x 5 in 2010; Dr. Darcey Nora  . Syncope     Negative EP evaluation  . HTN (hypertension)   . Hyperlipidemia   . ED (erectile dysfunction)   . Hypothyroid   . LBBB (left bundle branch block)   . Cancer 2005    throat    Past Surgical History  Procedure Laterality Date  . Coronary artery bypass graft  06/06/09    x 5  . Cardiac catheterization    . 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 Incisional hernia repair    . Total knee arthroplasty Bilateral 09/04/2014    Procedure: Intra-articular injection Left knee, Excision of Chronic Pre-patella Bursa Right Knee , Right Total Knee Arthroplasty;  Surgeon: Ninetta Lights, MD;  Location: [facility];  Service: Orthopedics;  Laterality: Bilateral;     (Not in a hospital admission) No Known Allergies  Social History  Substance Use Topics  . Smoking status: Never Smoker   . Smokeless tobacco: Never Used  . Alcohol Use: No    Family History  Problem Relation Age of Onset  . Heart attack Father   . Ovarian cancer Mother      Review of Systems  Constitutional: Negative.   HENT: Negative.   Eyes: Negative.   Respiratory: Negative.   Cardiovascular: Negative.   Gastrointestinal: Negative.   Genitourinary: Negative.   Musculoskeletal: Positive for joint pain.  Skin: Negative.   Neurological: Negative.   Endo/Heme/Allergies: Negative.   Psychiatric/Behavioral: Negative.     Objective:  Physical Exam  Constitutional: He is oriented to person, place, and time. He appears well-developed and well-nourished.  HENT:  Head: Normocephalic and atraumatic.  Eyes: EOM are normal. Pupils are equal, round, and reactive to light.  Neck: Normal range of motion. Neck supple.  Cardiovascular: Normal rate and regular rhythm.   Respiratory: Effort normal and breath sounds normal.  GI: Soft. Bowel sounds are normal.  Musculoskeletal:  Examination of his left knee reveals range of motion of 0-125 degrees. Minimal patellofemoral crepitus. Minimal joint line tenderness. Negative log roll. Negative straight leg raise.   Neurological: He is alert and oriented to person, place, and time.  Skin: Skin is  warm and dry.  Psychiatric: He has a normal mood and affect. His behavior is normal. Judgment and thought content normal.    Vital signs in last 24 hours: @VSRANGES @  Labs:   Estimated body mass index is 27.50 kg/(m^2) as calculated from the following:   Height as of 06/17/15: 6\' 3"  (1.905 m).   Weight as of 06/17/15: 99.791 kg (220 lb).   Imaging Review Plain radiographs demonstrate severe degenerative joint disease of the left knee(s). The overall alignment  ismild varus. The bone quality appears to be fair for age and reported activity level.  Assessment/Plan:  End stage arthritis, left knee   The patient history, physical examination, clinical judgment of the provider and imaging studies are consistent with end stage degenerative joint disease of the left knee(s) and total knee arthroplasty is deemed medically necessary. The treatment options including medical management, injection therapy arthroscopy and arthroplasty were discussed at length. The risks and benefits of total knee arthroplasty were presented and reviewed. The risks due to aseptic loosening, infection, stiffness, patella tracking problems, thromboembolic complications and other imponderables were discussed. The patient acknowledged the explanation, agreed to proceed with the plan and consent was signed. Patient is being admitted for inpatient treatment for surgery, pain control, PT, OT, prophylactic antibiotics, VTE prophylaxis, progressive ambulation and ADL's and discharge planning. The patient is planning to be discharged home with home health services

## 2015-11-20 ENCOUNTER — Encounter (HOSPITAL_COMMUNITY): Payer: Self-pay

## 2015-11-20 ENCOUNTER — Encounter (HOSPITAL_COMMUNITY)
Admission: RE | Admit: 2015-11-20 | Discharge: 2015-11-20 | Disposition: A | Discharge status: Home / Self Care | Payer: 59 | Source: Ambulatory Visit | Attending: Orthopedic Surgery | Admitting: Orthopedic Surgery

## 2015-11-20 DIAGNOSIS — Z7982 Long term (current) use of aspirin: Secondary | ICD-10-CM | POA: Diagnosis not present

## 2015-11-20 DIAGNOSIS — Z79899 Other long term (current) drug therapy: Secondary | ICD-10-CM | POA: Diagnosis not present

## 2015-11-20 DIAGNOSIS — Z951 Presence of aortocoronary bypass graft: Secondary | ICD-10-CM | POA: Diagnosis not present

## 2015-11-20 DIAGNOSIS — Z923 Personal history of irradiation: Secondary | ICD-10-CM | POA: Insufficient documentation

## 2015-11-20 DIAGNOSIS — I10 Essential (primary) hypertension: Secondary | ICD-10-CM | POA: Insufficient documentation

## 2015-11-20 DIAGNOSIS — I447 Left bundle-branch block, unspecified: Secondary | ICD-10-CM | POA: Insufficient documentation

## 2015-11-20 DIAGNOSIS — M1712 Unilateral primary osteoarthritis, left knee: Secondary | ICD-10-CM | POA: Insufficient documentation

## 2015-11-20 DIAGNOSIS — Z01818 Encounter for other preprocedural examination: Secondary | ICD-10-CM | POA: Insufficient documentation

## 2015-11-20 DIAGNOSIS — I2510 Atherosclerotic heart disease of native coronary artery without angina pectoris: Secondary | ICD-10-CM | POA: Insufficient documentation

## 2015-11-20 DIAGNOSIS — Z85819 Personal history of malignant neoplasm of unspecified site of lip, oral cavity, and pharynx: Secondary | ICD-10-CM | POA: Insufficient documentation

## 2015-11-20 DIAGNOSIS — E785 Hyperlipidemia, unspecified: Secondary | ICD-10-CM | POA: Insufficient documentation

## 2015-11-20 DIAGNOSIS — Z01812 Encounter for preprocedural laboratory examination: Secondary | ICD-10-CM | POA: Diagnosis not present

## 2015-11-20 DIAGNOSIS — E039 Hypothyroidism, unspecified: Secondary | ICD-10-CM | POA: Insufficient documentation

## 2015-11-20 DIAGNOSIS — Z9221 Personal history of antineoplastic chemotherapy: Secondary | ICD-10-CM | POA: Insufficient documentation

## 2015-11-20 HISTORY — DX: Unspecified osteoarthritis, unspecified site: M19.90

## 2015-11-20 HISTORY — DX: Other intervertebral disc degeneration, lumbar region: M51.36

## 2015-11-20 HISTORY — DX: Other intervertebral disc degeneration, lumbar region without mention of lumbar back pain or lower extremity pain: M51.369

## 2015-11-20 LAB — CBC
HCT: 36.4 % — ABNORMAL LOW (ref 39.0–52.0)
Hemoglobin: 11.8 g/dL — ABNORMAL LOW (ref 13.0–17.0)
MCH: 27.9 pg (ref 26.0–34.0)
MCHC: 32.4 g/dL (ref 30.0–36.0)
MCV: 86.1 fL (ref 78.0–100.0)
PLATELETS: 181 10*3/uL (ref 150–400)
RBC: 4.23 MIL/uL (ref 4.22–5.81)
RDW: 13.4 % (ref 11.5–15.5)
WBC: 5.9 10*3/uL (ref 4.0–10.5)

## 2015-11-20 LAB — BASIC METABOLIC PANEL
Anion gap: 8 (ref 5–15)
BUN: 18 mg/dL (ref 6–20)
CHLORIDE: 107 mmol/L (ref 101–111)
CO2: 27 mmol/L (ref 22–32)
CREATININE: 1.07 mg/dL (ref 0.61–1.24)
Calcium: 9.3 mg/dL (ref 8.9–10.3)
GFR calc Af Amer: >60 mL/min (ref >60)
GFR calc non Af Amer: >60 mL/min (ref >60)
Glucose, Bld: 102 mg/dL — ABNORMAL HIGH (ref 65–99)
Potassium: 4.2 mmol/L (ref 3.5–5.1)
SODIUM: 142 mmol/L (ref 135–145)

## 2015-11-20 LAB — SURGICAL PCR SCREEN
MRSA, PCR: NEGATIVE
STAPHYLOCOCCUS AUREUS: POSITIVE — AB

## 2015-11-20 NOTE — Progress Notes (Signed)
Requesting last EKG from Dr. Cristi Loron, pt. Thinks it was done within a year. Pt. Denies any chest, breathing concerns today, denies flu like symptoms. Will have chart reviewed by Anesth.- pt. Had CABG- 2010, last stress test- 2011. Followed by Dr. Mare Ferrari.  Uneventful anesth. with last surg. For TKA- /w regional block & spinal.

## 2015-11-20 NOTE — Pre-Procedure Instructions (Signed)
[name]  11/20/2015      [id number] [place] [address], [place] [id number] [place] [id number] Phone: [phone] Fax: [phone]  [facility] [place], [place] [address] Phone: [phone] Fax: [phone]  [place], [place] [address] [place] [address] [place] [address] Phone: [phone] Fax: [phone]  [facility] [address] [place], [place] [address] Phone: [phone] Fax: [phone]    Your procedure is scheduled on 12/03/2015  Report to [facility] Admitting at 8:45 A.M.  Call this number if you have problems the morning of surgery:  [phone]   Remember:  Do not eat food or drink liquids after midnight.  On Tuesday Night    Take these medicines the morning of surgery with A SIP OF WATER : Levothyroxine, Metoprolol   Do not wear jewelry, make-up or nail polish.   Do not wear lotions, powders, or perfumes.  You may wear deodorant.    Men may shave face and neck.   Do not bring valuables to the hospital.   [facility] is not responsible for any belongings or valuables.  Contacts, dentures or bridgework may not be worn into surgery.  Leave your suitcase in the car.  After surgery it may be brought to your room.  For patients admitted to the hospital, discharge time will be determined by your treatment team.  Patients discharged the day of surgery will not be allowed to drive home.   Name and phone number of your driver:   With wife  Special instructions:  Special Instructions: [facility] - Preparing for Surgery  Before surgery, you can play an important role.  Because skin is not sterile, your skin needs to be as free of germs as possible.  You can reduce the number of germs on you skin by washing with CHG  (chlorahexidine gluconate) soap before surgery.  CHG is an antiseptic cleaner which kills germs and bonds with the skin to continue killing germs even after washing.  Please DO NOT use if you have an allergy to CHG or antibacterial soaps.  If your skin becomes reddened/irritated stop using the CHG and inform your nurse when you arrive at Short Stay.  Do not shave (including legs and underarms) for at least 48 hours prior to the first CHG shower.  You may shave your face.  Please follow these instructions carefully:   1.  Shower with CHG Soap the night before surgery and the  morning of Surgery.  2.  If you choose to wash your hair, wash your hair first as usual with your  normal shampoo.  3.  After you shampoo, rinse your hair and body thoroughly to remove the  Shampoo.  4.  Use CHG as you would any other liquid soap.  You can apply chg directly to the skin and wash gently with scrungie or a clean washcloth.  5.  Apply the CHG Soap to your body ONLY FROM THE NECK DOWN.    Do not use on open wounds or open sores.  Avoid contact with your eyes, ears, mouth and genitals (private parts).  Wash genitals (private parts)   with your normal soap.  6.  Wash thoroughly, paying special attention to the area where your surgery will be performed.  7.  Thoroughly rinse your body with warm water from the neck down.  8.  DO NOT shower/wash with your normal soap after using and rinsing off   the CHG Soap.  9.  Pat yourself dry with a clean towel.            10.  Wear clean pajamas.            11.  Place clean sheets on your bed the night of your first shower and do not sleep with pets.  Day of Surgery  Do not apply any lotions/deodorants the morning of surgery.  Please wear clean clothes to the hospital/surgery center.  Please read over the following fact sheets that you were given. Pain Booklet, Coughing and Deep Breathing, MRSA Information and Surgical Site Infection Prevention

## 2015-11-25 NOTE — Progress Notes (Signed)
Anesthesia Chart Review: Patient is a 70 year old male scheduled for left TKA 12/03/15 by Dr. Kathryne Hitch.   History includes nonsmoker, CAD s/p CABG (LIMA-LAD, SVG-Ramus INT, SVG-OM2-OM3) 06/06/09, syncope (with normal EP study by 2015 cardiology notes), hypertension, hyperlipidemia, ED, hypothyroidism, left bundle branch block, arthritis, throat cancer s/p chemoradiation 2005, right TKA 10/114/15. PCP is Dr. Melinda Crutch. Cardiologist is Dr. Darlin Coco, last visit 06/17/15. Dr. Mare Ferrari signed a note of medical and cardiac clearance on 11/03/15.   Meds include ASA 81 mg, levothyroxine, Lopressor, Cialis.  06/09/09 Echo: Study Conclusions 1. Left ventricle: The cavity size was mildly dilated. Wall  thickness was normal. The estimated ejection fraction was 55%.  Left ventricular diastolic function parameters were normal. 2. Ventricular septum: Septal motion showed paradox. These changes  are consistent with a left bundle branch block. 3. Left atrium: The atrium was mildly dilated. 4. Pericardium, extracardiac: A trivial pericardial effusion was  identified. Features were not consistent with tamponade  physiology.  Last cardiac cath was on 06/05/09 pre-CABG.  Patient thought he had an EKG done at Dr. Harrington Challenger' office within the past year, but last EKG received was from 06/04/09. Dr. Mare Ferrari did not do an EKG at his 06/17/15 visit, so his last EKG was on 11/29/14 which is > 1 year ago. He will need a new EKG on the day of surgery. He has a known left BBB since at least 07/13/11.  Preoperative labs noted.   Patient has been cleared by his cardiologist. He will need an EKG on arrival, but with known left BBB. If EKG is stable, and he remains asymptomatic from a CV standpoint then I would anticipate that he could proceed as planned. Further evaluation by his anesthesiologist on the day of surgery.  [facility] Short Stay Center/Anesthesiology Phone [phone] 11/25/2015 12:44 PM

## 2015-12-02 MED ORDER — LACTATED RINGERS IV SOLN
INTRAVENOUS | Status: DC
  Administered 2015-12-03 (×2): via INTRAVENOUS

## 2015-12-02 MED ORDER — CEFAZOLIN SODIUM-DEXTROSE 2-3 GM-% IV SOLR
2.0000 g | INTRAVENOUS | Status: AC
  Administered 2015-12-03: 2 g via INTRAVENOUS
  Filled 2015-12-02: qty 50

## 2015-12-02 MED ORDER — CHLORHEXIDINE GLUCONATE 4 % EX LIQD
60.0000 mL | Freq: Once | CUTANEOUS | Status: DC
Start: 2015-12-03 — End: 2015-12-03

## 2015-12-02 MED ORDER — SODIUM CHLORIDE 0.9 % IV SOLN
1000.0000 mg | INTRAVENOUS | Status: AC
  Administered 2015-12-03: 1000 mg via INTRAVENOUS
  Filled 2015-12-02: qty 10

## 2015-12-03 ENCOUNTER — Inpatient Hospital Stay (HOSPITAL_COMMUNITY): Payer: Medicare Other | Admitting: Anesthesiology

## 2015-12-03 ENCOUNTER — Inpatient Hospital Stay (HOSPITAL_COMMUNITY)
Admission: RE | Admit: 2015-12-03 | Discharge: 2015-12-05 | DRG: 470 | Disposition: A | Discharge status: Home Health | Payer: Medicare Other | Source: Ambulatory Visit | Attending: Orthopedic Surgery | Admitting: Orthopedic Surgery

## 2015-12-03 ENCOUNTER — Inpatient Hospital Stay (HOSPITAL_COMMUNITY): Payer: Medicare Other | Admitting: Vascular Surgery

## 2015-12-03 ENCOUNTER — Encounter (HOSPITAL_COMMUNITY)
Admission: RE | Disposition: A | Discharge status: Home Health | Payer: Self-pay | Source: Ambulatory Visit | Attending: Orthopedic Surgery

## 2015-12-03 ENCOUNTER — Inpatient Hospital Stay (HOSPITAL_COMMUNITY): Payer: Medicare Other

## 2015-12-03 DIAGNOSIS — Z96659 Presence of unspecified artificial knee joint: Secondary | ICD-10-CM

## 2015-12-03 DIAGNOSIS — M1710 Unilateral primary osteoarthritis, unspecified knee: Secondary | ICD-10-CM | POA: Diagnosis present

## 2015-12-03 DIAGNOSIS — I2510 Atherosclerotic heart disease of native coronary artery without angina pectoris: Secondary | ICD-10-CM | POA: Diagnosis present

## 2015-12-03 DIAGNOSIS — M179 Osteoarthritis of knee, unspecified: Secondary | ICD-10-CM | POA: Diagnosis not present

## 2015-12-03 DIAGNOSIS — M1712 Unilateral primary osteoarthritis, left knee: Secondary | ICD-10-CM | POA: Diagnosis not present

## 2015-12-03 DIAGNOSIS — Z8249 Family history of ischemic heart disease and other diseases of the circulatory system: Secondary | ICD-10-CM

## 2015-12-03 DIAGNOSIS — Z96652 Presence of left artificial knee joint: Secondary | ICD-10-CM | POA: Diagnosis not present

## 2015-12-03 DIAGNOSIS — R339 Retention of urine, unspecified: Secondary | ICD-10-CM | POA: Diagnosis not present

## 2015-12-03 DIAGNOSIS — Z96651 Presence of right artificial knee joint: Secondary | ICD-10-CM | POA: Diagnosis present

## 2015-12-03 DIAGNOSIS — Z951 Presence of aortocoronary bypass graft: Secondary | ICD-10-CM | POA: Diagnosis not present

## 2015-12-03 DIAGNOSIS — E785 Hyperlipidemia, unspecified: Secondary | ICD-10-CM | POA: Diagnosis present

## 2015-12-03 DIAGNOSIS — I10 Essential (primary) hypertension: Secondary | ICD-10-CM | POA: Diagnosis present

## 2015-12-03 DIAGNOSIS — Z85818 Personal history of malignant neoplasm of other sites of lip, oral cavity, and pharynx: Secondary | ICD-10-CM | POA: Diagnosis not present

## 2015-12-03 DIAGNOSIS — Z471 Aftercare following joint replacement surgery: Secondary | ICD-10-CM | POA: Diagnosis not present

## 2015-12-03 DIAGNOSIS — Z8041 Family history of malignant neoplasm of ovary: Secondary | ICD-10-CM | POA: Diagnosis not present

## 2015-12-03 DIAGNOSIS — E039 Hypothyroidism, unspecified: Secondary | ICD-10-CM | POA: Diagnosis present

## 2015-12-03 DIAGNOSIS — I447 Left bundle-branch block, unspecified: Secondary | ICD-10-CM | POA: Diagnosis present

## 2015-12-03 DIAGNOSIS — D62 Acute posthemorrhagic anemia: Secondary | ICD-10-CM | POA: Diagnosis not present

## 2015-12-03 HISTORY — PX: TOTAL KNEE ARTHROPLASTY: SHX125

## 2015-12-03 SURGERY — ARTHROPLASTY, KNEE, TOTAL
Anesthesia: Monitor Anesthesia Care | Site: Knee | Laterality: Left

## 2015-12-03 MED ORDER — PHENOL 1.4 % MT LIQD: 1.0000 | OROMUCOSAL | Status: DC | PRN

## 2015-12-03 MED ORDER — LEVOTHYROXINE SODIUM 75 MCG PO TABS
150.0000 ug | ORAL_TABLET | Freq: Every day | ORAL | Status: DC
  Administered 2015-12-04 – 2015-12-05 (×2): 150 ug via ORAL
  Filled 2015-12-03 (×2): qty 2

## 2015-12-03 MED ORDER — OXYCODONE HCL 5 MG PO TABS
5.0000 mg | ORAL_TABLET | ORAL | Status: DC | PRN
  Administered 2015-12-03 – 2015-12-05 (×10): 10 mg via ORAL
  Filled 2015-12-03 (×9): qty 2

## 2015-12-03 MED ORDER — FENTANYL CITRATE (PF) 250 MCG/5ML IJ SOLN
INTRAMUSCULAR | Status: DC | PRN
Start: 2015-12-03 — End: 2015-12-03
  Administered 2015-12-03: 50 ug via INTRAVENOUS
  Administered 2015-12-03: 100 ug via INTRAVENOUS
  Administered 2015-12-03 (×2): 50 ug via INTRAVENOUS

## 2015-12-03 MED ORDER — PHENYLEPHRINE HCL 10 MG/ML IJ SOLN
INTRAMUSCULAR | Status: DC | PRN
  Administered 2015-12-03 (×2): 80 ug via INTRAVENOUS

## 2015-12-03 MED ORDER — EPHEDRINE SULFATE 50 MG/ML IJ SOLN
INTRAMUSCULAR | Status: DC | PRN
  Administered 2015-12-03: 5 mg via INTRAVENOUS

## 2015-12-03 MED ORDER — BUPIVACAINE LIPOSOME 1.3 % IJ SUSP
20.0000 mL | Freq: Once | INTRAMUSCULAR | Status: DC
  Filled 2015-12-03: qty 20

## 2015-12-03 MED ORDER — METOCLOPRAMIDE HCL 5 MG/ML IJ SOLN: 5.0000 mg | Freq: Three times a day (TID) | INTRAMUSCULAR | Status: DC | PRN

## 2015-12-03 MED ORDER — ACETAMINOPHEN 325 MG PO TABS: 650.0000 mg | ORAL_TABLET | Freq: Four times a day (QID) | ORAL | Status: DC | PRN

## 2015-12-03 MED ORDER — LIDOCAINE HCL (CARDIAC) 20 MG/ML IV SOLN
INTRAVENOUS | Status: DC | PRN
  Administered 2015-12-03: 60 mg via INTRATRACHEAL

## 2015-12-03 MED ORDER — BUPIVACAINE HCL 0.5 % IJ SOLN
INTRAMUSCULAR | Status: DC | PRN
  Administered 2015-12-03: 10 mL

## 2015-12-03 MED ORDER — HYDROMORPHONE HCL 1 MG/ML IJ SOLN
INTRAMUSCULAR | Status: AC
  Filled 2015-12-03: qty 1

## 2015-12-03 MED ORDER — BISACODYL 5 MG PO TBEC: 5.0000 mg | DELAYED_RELEASE_TABLET | Freq: Every day | ORAL | Status: DC | PRN

## 2015-12-03 MED ORDER — ONDANSETRON HCL 4 MG/2ML IJ SOLN
INTRAMUSCULAR | Status: AC
  Filled 2015-12-03: qty 2

## 2015-12-03 MED ORDER — PHENYLEPHRINE 40 MCG/ML (10ML) SYRINGE FOR IV PUSH (FOR BLOOD PRESSURE SUPPORT)
PREFILLED_SYRINGE | INTRAVENOUS | Status: AC
  Filled 2015-12-03: qty 10

## 2015-12-03 MED ORDER — SODIUM CHLORIDE 0.9 % IJ SOLN
INTRAMUSCULAR | Status: DC | PRN
  Administered 2015-12-03: 10 mL

## 2015-12-03 MED ORDER — ALUM & MAG HYDROXIDE-SIMETH 200-200-20 MG/5ML PO SUSP: 30.0000 mL | ORAL | Status: DC | PRN

## 2015-12-03 MED ORDER — ONDANSETRON HCL 4 MG/2ML IJ SOLN
INTRAMUSCULAR | Status: DC | PRN
  Administered 2015-12-03: 4 mg via INTRAVENOUS

## 2015-12-03 MED ORDER — GLYCOPYRROLATE 0.2 MG/ML IJ SOLN
INTRAMUSCULAR | Status: DC | PRN
  Administered 2015-12-03: 0.2 mg via INTRAVENOUS

## 2015-12-03 MED ORDER — DOCUSATE SODIUM 100 MG PO CAPS
100.0000 mg | ORAL_CAPSULE | Freq: Two times a day (BID) | ORAL | Status: DC
  Administered 2015-12-03 – 2015-12-05 (×4): 100 mg via ORAL
  Filled 2015-12-03 (×4): qty 1

## 2015-12-03 MED ORDER — POLYETHYLENE GLYCOL 3350 17 G PO PACK
17.0000 g | PACK | Freq: Every day | ORAL | Status: DC | PRN
Start: 2015-12-03 — End: 2015-12-05

## 2015-12-03 MED ORDER — MIDAZOLAM HCL 2 MG/2ML IJ SOLN
INTRAMUSCULAR | Status: AC
  Filled 2015-12-03: qty 2

## 2015-12-03 MED ORDER — ONDANSETRON HCL 4 MG PO TABS: 4.0000 mg | ORAL_TABLET | Freq: Four times a day (QID) | ORAL | Status: DC | PRN

## 2015-12-03 MED ORDER — PROMETHAZINE HCL 25 MG/ML IJ SOLN: 6.2500 mg | INTRAMUSCULAR | Status: DC | PRN

## 2015-12-03 MED ORDER — PROPOFOL 10 MG/ML IV BOLUS
INTRAVENOUS | Status: AC
  Filled 2015-12-03: qty 20

## 2015-12-03 MED ORDER — DIAZEPAM 2 MG PO TABS: 2.0000 mg | ORAL_TABLET | Freq: Three times a day (TID) | ORAL | Status: DC | PRN

## 2015-12-03 MED ORDER — LIDOCAINE HCL (CARDIAC) 20 MG/ML IV SOLN
INTRAVENOUS | Status: AC
  Filled 2015-12-03: qty 5

## 2015-12-03 MED ORDER — DEXAMETHASONE SODIUM PHOSPHATE 10 MG/ML IJ SOLN
10.0000 mg | Freq: Once | INTRAMUSCULAR | Status: AC
  Administered 2015-12-04: 10 mg via INTRAVENOUS
  Filled 2015-12-03: qty 1

## 2015-12-03 MED ORDER — DIAZEPAM 2 MG PO TABS
2.0000 mg | ORAL_TABLET | Freq: Three times a day (TID) | ORAL | Status: DC | PRN
  Administered 2015-12-03 – 2015-12-04 (×3): 2 mg via ORAL
  Filled 2015-12-03 (×4): qty 1

## 2015-12-03 MED ORDER — LACTATED RINGERS IV SOLN
INTRAVENOUS | Status: DC
  Administered 2015-12-03: 10:00:00 via INTRAVENOUS

## 2015-12-03 MED ORDER — BUPIVACAINE HCL (PF) 0.5 % IJ SOLN
INTRAMUSCULAR | Status: AC
  Filled 2015-12-03: qty 30

## 2015-12-03 MED ORDER — POTASSIUM CHLORIDE IN NACL 20-0.9 MEQ/L-% IV SOLN
INTRAVENOUS | Status: DC
  Administered 2015-12-03 – 2015-12-04 (×2): via INTRAVENOUS
  Filled 2015-12-03 (×3): qty 1000

## 2015-12-03 MED ORDER — APIXABAN 2.5 MG PO TABS: ORAL_TABLET | ORAL | Status: DC

## 2015-12-03 MED ORDER — MENTHOL 3 MG MT LOZG: 1.0000 | LOZENGE | OROMUCOSAL | Status: DC | PRN

## 2015-12-03 MED ORDER — HYDROMORPHONE HCL 1 MG/ML IJ SOLN
0.2500 mg | INTRAMUSCULAR | Status: DC | PRN
  Administered 2015-12-03 (×5): 0.5 mg via INTRAVENOUS

## 2015-12-03 MED ORDER — ACETAMINOPHEN 650 MG RE SUPP: 650.0000 mg | Freq: Four times a day (QID) | RECTAL | Status: DC | PRN

## 2015-12-03 MED ORDER — OXYCODONE-ACETAMINOPHEN 5-325 MG PO TABS: 1.0000 | ORAL_TABLET | ORAL | Status: DC | PRN

## 2015-12-03 MED ORDER — PROPOFOL 10 MG/ML IV BOLUS
INTRAVENOUS | Status: DC | PRN
  Administered 2015-12-03: 50 mg via INTRAVENOUS

## 2015-12-03 MED ORDER — OXYCODONE HCL 5 MG/5ML PO SOLN: 5.0000 mg | Freq: Once | ORAL | Status: DC | PRN

## 2015-12-03 MED ORDER — HYDROMORPHONE HCL 1 MG/ML IJ SOLN: 0.5000 mg | INTRAMUSCULAR | Status: DC | PRN

## 2015-12-03 MED ORDER — DIPHENHYDRAMINE HCL 12.5 MG/5ML PO ELIX: 12.5000 mg | ORAL_SOLUTION | ORAL | Status: DC | PRN

## 2015-12-03 MED ORDER — METOPROLOL TARTRATE 100 MG PO TABS: 100.0000 mg | ORAL_TABLET | Freq: Two times a day (BID) | ORAL | Status: DC

## 2015-12-03 MED ORDER — EPHEDRINE SULFATE 50 MG/ML IJ SOLN
INTRAMUSCULAR | Status: AC
  Filled 2015-12-03: qty 1

## 2015-12-03 MED ORDER — METOCLOPRAMIDE HCL 5 MG PO TABS
5.0000 mg | ORAL_TABLET | Freq: Three times a day (TID) | ORAL | Status: DC | PRN
  Administered 2015-12-04: 10 mg via ORAL
  Filled 2015-12-03: qty 2

## 2015-12-03 MED ORDER — ONDANSETRON HCL 4 MG PO TABS: 4.0000 mg | ORAL_TABLET | Freq: Three times a day (TID) | ORAL | Status: DC | PRN

## 2015-12-03 MED ORDER — CELECOXIB 200 MG PO CAPS
200.0000 mg | ORAL_CAPSULE | Freq: Two times a day (BID) | ORAL | Status: DC
  Administered 2015-12-03 – 2015-12-05 (×4): 200 mg via ORAL
  Filled 2015-12-03 (×4): qty 1

## 2015-12-03 MED ORDER — APIXABAN 2.5 MG PO TABS
2.5000 mg | ORAL_TABLET | Freq: Two times a day (BID) | ORAL | Status: DC
  Administered 2015-12-04 – 2015-12-05 (×3): 2.5 mg via ORAL
  Filled 2015-12-03 (×3): qty 1

## 2015-12-03 MED ORDER — MAGNESIUM CITRATE PO SOLN: 1.0000 | Freq: Once | ORAL | Status: DC | PRN

## 2015-12-03 MED ORDER — ZOLPIDEM TARTRATE 5 MG PO TABS: 5.0000 mg | ORAL_TABLET | Freq: Every evening | ORAL | Status: DC | PRN

## 2015-12-03 MED ORDER — PROPOFOL 500 MG/50ML IV EMUL
INTRAVENOUS | Status: DC | PRN
  Administered 2015-12-03: 50 ug/kg/min via INTRAVENOUS

## 2015-12-03 MED ORDER — CEFAZOLIN SODIUM-DEXTROSE 2-3 GM-% IV SOLR
2.0000 g | Freq: Four times a day (QID) | INTRAVENOUS | Status: AC
  Administered 2015-12-03 – 2015-12-04 (×2): 2 g via INTRAVENOUS
  Filled 2015-12-03 (×2): qty 50

## 2015-12-03 MED ORDER — OXYCODONE HCL 5 MG PO TABS: 5.0000 mg | ORAL_TABLET | Freq: Once | ORAL | Status: DC | PRN

## 2015-12-03 MED ORDER — BUPIVACAINE LIPOSOME 1.3 % IJ SUSP
INTRAMUSCULAR | Status: DC | PRN
  Administered 2015-12-03: 20 mL

## 2015-12-03 MED ORDER — MIDAZOLAM HCL 2 MG/2ML IJ SOLN
INTRAMUSCULAR | Status: DC | PRN
  Administered 2015-12-03: 2 mg via INTRAVENOUS

## 2015-12-03 MED ORDER — FENTANYL CITRATE (PF) 250 MCG/5ML IJ SOLN
INTRAMUSCULAR | Status: AC
  Filled 2015-12-03: qty 5

## 2015-12-03 MED ORDER — GLYCOPYRROLATE 0.2 MG/ML IJ SOLN
INTRAMUSCULAR | Status: AC
  Filled 2015-12-03: qty 1

## 2015-12-03 MED ORDER — OXYCODONE HCL 5 MG PO TABS
ORAL_TABLET | ORAL | Status: AC
  Administered 2015-12-03: 10 mg via ORAL
  Filled 2015-12-03: qty 2

## 2015-12-03 MED ORDER — BISACODYL 10 MG RE SUPP: 10.0000 mg | Freq: Every day | RECTAL | Status: DC | PRN

## 2015-12-03 MED ORDER — SODIUM CHLORIDE 0.9 % IR SOLN
Status: DC | PRN
  Administered 2015-12-03: 3000 mL

## 2015-12-03 MED ORDER — ONDANSETRON HCL 4 MG/2ML IJ SOLN: 4.0000 mg | Freq: Four times a day (QID) | INTRAMUSCULAR | Status: DC | PRN

## 2015-12-03 SURGICAL SUPPLY — 68 items
BANDAGE ACE 6X5 VEL STRL LF (GAUZE/BANDAGES/DRESSINGS) ×3 IMPLANT
BANDAGE ELASTIC 4 VELCRO ST LF (GAUZE/BANDAGES/DRESSINGS) ×3 IMPLANT
BANDAGE ELASTIC 6 VELCRO ST LF (GAUZE/BANDAGES/DRESSINGS) ×3 IMPLANT
BANDAGE ESMARK 6X9 LF (GAUZE/BANDAGES/DRESSINGS) ×1 IMPLANT
BENZOIN TINCTURE PRP APPL 2/3 (GAUZE/BANDAGES/DRESSINGS) ×3 IMPLANT
BLADE SAG 18X100X1.27 (BLADE) ×6 IMPLANT
BNDG ESMARK 6X9 LF (GAUZE/BANDAGES/DRESSINGS) ×3
BOWL SMART MIX CTS (DISPOSABLE) ×3 IMPLANT
CAPT KNEE TOTAL 3 ×3 IMPLANT
CEMENT BONE SIMPLEX SPEEDSET (Cement) ×6 IMPLANT
CLOSURE STERI-STRIP 1/2X4 (GAUZE/BANDAGES/DRESSINGS) ×2
CLOSURE WOUND 1/2 X4 (GAUZE/BANDAGES/DRESSINGS) ×2
CLSR STERI-STRIP ANTIMIC 1/2X4 (GAUZE/BANDAGES/DRESSINGS) ×4 IMPLANT
COVER SURGICAL LIGHT HANDLE (MISCELLANEOUS) ×3 IMPLANT
CUFF TOURNIQUET SINGLE 34IN LL (TOURNIQUET CUFF) ×3 IMPLANT
DRAPE EXTREMITY T 121X128X90 (DRAPE) ×3 IMPLANT
DRAPE IMP U-DRAPE 54X76 (DRAPES) ×3 IMPLANT
DRAPE PROXIMA HALF (DRAPES) ×3 IMPLANT
DRAPE U-SHAPE 47X51 STRL (DRAPES) ×3 IMPLANT
DRSG PAD ABDOMINAL 8X10 ST (GAUZE/BANDAGES/DRESSINGS) ×3 IMPLANT
DURAPREP 26ML APPLICATOR (WOUND CARE) ×6 IMPLANT
ELECT CAUTERY BLADE 6.4 (BLADE) ×3 IMPLANT
ELECT REM PT RETURN 9FT ADLT (ELECTROSURGICAL) ×3
ELECTRODE REM PT RTRN 9FT ADLT (ELECTROSURGICAL) ×1 IMPLANT
EVACUATOR 1/8 PVC DRAIN (DRAIN) ×3 IMPLANT
FACESHIELD WRAPAROUND (MASK) ×6 IMPLANT
GAUZE SPONGE 4X4 12PLY STRL (GAUZE/BANDAGES/DRESSINGS) ×3 IMPLANT
GLOVE BIOGEL PI IND STRL 7.0 (GLOVE) ×1 IMPLANT
GLOVE BIOGEL PI INDICATOR 7.0 (GLOVE) ×2
GLOVE ORTHO TXT STRL SZ7.5 (GLOVE) ×3 IMPLANT
GLOVE SURG ORTHO 7.0 STRL STRW (GLOVE) ×3 IMPLANT
GOWN STRL REUS W/ TWL LRG LVL3 (GOWN DISPOSABLE) ×2 IMPLANT
GOWN STRL REUS W/ TWL XL LVL3 (GOWN DISPOSABLE) ×1 IMPLANT
GOWN STRL REUS W/TWL LRG LVL3 (GOWN DISPOSABLE) ×4
GOWN STRL REUS W/TWL XL LVL3 (GOWN DISPOSABLE) ×2
HANDPIECE INTERPULSE COAX TIP (DISPOSABLE) ×2
IMMOBILIZER KNEE 22 UNIV (SOFTGOODS) IMPLANT
IMMOBILIZER KNEE 24 THIGH 36 (MISCELLANEOUS) ×1 IMPLANT
IMMOBILIZER KNEE 24 UNIV (MISCELLANEOUS) ×3
KIT BASIN OR (CUSTOM PROCEDURE TRAY) ×3 IMPLANT
KIT ROOM TURNOVER OR (KITS) ×3 IMPLANT
MANIFOLD NEPTUNE II (INSTRUMENTS) ×3 IMPLANT
NEEDLE 18GX1X1/2 (RX/OR ONLY) (NEEDLE) ×3 IMPLANT
NEEDLE HYPO 25GX1X1/2 BEV (NEEDLE) ×3 IMPLANT
NS IRRIG 1000ML POUR BTL (IV SOLUTION) ×3 IMPLANT
PACK TOTAL JOINT (CUSTOM PROCEDURE TRAY) ×3 IMPLANT
PACK UNIVERSAL I (CUSTOM PROCEDURE TRAY) ×3 IMPLANT
PAD ARMBOARD 7.5X6 YLW CONV (MISCELLANEOUS) ×6 IMPLANT
PAD CAST 4YDX4 CTTN HI CHSV (CAST SUPPLIES) ×1 IMPLANT
PADDING CAST ABS 4INX4YD NS (CAST SUPPLIES) ×2
PADDING CAST ABS COTTON 4X4 ST (CAST SUPPLIES) ×1 IMPLANT
PADDING CAST COTTON 4X4 STRL (CAST SUPPLIES) ×2
SET HNDPC FAN SPRY TIP SCT (DISPOSABLE) ×1 IMPLANT
SPONGE GAUZE 4X4 12PLY STER LF (GAUZE/BANDAGES/DRESSINGS) ×3 IMPLANT
STRIP CLOSURE SKIN 1/2X4 (GAUZE/BANDAGES/DRESSINGS) ×4 IMPLANT
SUCTION FRAZIER TIP 10 FR DISP (SUCTIONS) ×3 IMPLANT
SUT MNCRL AB 4-0 PS2 18 (SUTURE) ×3 IMPLANT
SUT VIC AB 0 CT1 27 (SUTURE)
SUT VIC AB 0 CT1 27XBRD ANBCTR (SUTURE) IMPLANT
SUT VIC AB 1 CTX 36 (SUTURE) ×2
SUT VIC AB 1 CTX36XBRD ANBCTR (SUTURE) ×1 IMPLANT
SUT VIC AB 2-0 CT1 27 (SUTURE) ×4
SUT VIC AB 2-0 CT1 TAPERPNT 27 (SUTURE) ×2 IMPLANT
SYR 50ML LL SCALE MARK (SYRINGE) ×3 IMPLANT
SYR CONTROL 10ML LL (SYRINGE) ×3 IMPLANT
TOWEL OR 17X24 6PK STRL BLUE (TOWEL DISPOSABLE) ×3 IMPLANT
TOWEL OR 17X26 10 PK STRL BLUE (TOWEL DISPOSABLE) ×3 IMPLANT
WATER STERILE IRR 1000ML POUR (IV SOLUTION) ×3 IMPLANT

## 2015-12-03 NOTE — Anesthesia Preprocedure Evaluation (Signed)
Anesthesia Evaluation  Patient identified by MRN, date of birth, ID band Patient awake    Reviewed: Allergy & Precautions, NPO status , Patient's Chart, lab work & pertinent test results, reviewed documented beta blocker date and time   Airway Mallampati: II  TM Distance: >3 FB Neck ROM: Full    Dental  (+) Dental Advisory Given   Pulmonary neg pulmonary ROS,    breath sounds clear to auscultation       Cardiovascular hypertension, Pt. on medications and Pt. on home beta blockers + CAD and + CABG  + dysrhythmias (LBBB)  Rhythm:Regular Rate:Normal     Neuro/Psych negative neurological ROS     GI/Hepatic negative GI ROS, Neg liver ROS,   Endo/Other  Hypothyroidism   Renal/GU negative Renal ROS     Musculoskeletal  (+) Arthritis ,   Abdominal   Peds  Hematology negative hematology ROS (+)   Anesthesia Other Findings   Reproductive/Obstetrics                             Lab Results  Component Value Date   WBC 5.9 11/20/2015   HGB 11.8* 11/20/2015   HCT 36.4* 11/20/2015   MCV 86.1 11/20/2015   PLT 181 11/20/2015   Lab Results  Component Value Date   CREATININE 1.07 11/20/2015   BUN 18 11/20/2015   NA 142 11/20/2015   K 4.2 11/20/2015   CL 107 11/20/2015   CO2 27 11/20/2015   Lab Results  Component Value Date   INR 1.01 08/26/2014   INR 1.0 06/15/2009   INR 1.0 06/14/2009    Anesthesia Physical Anesthesia Plan  ASA: III  Anesthesia Plan: MAC and Spinal   Post-op Pain Management:    Induction: Intravenous  Airway Management Planned: Natural Airway and Simple Face Mask  Additional Equipment:   Intra-op Plan:   Post-operative Plan:   Informed Consent: I have reviewed the patients History and Physical, chart, labs and discussed the procedure including the risks, benefits and alternatives for the proposed anesthesia with the patient or authorized representative who  has indicated his/her understanding and acceptance.   Dental advisory given  Plan Discussed with:   Anesthesia Plan Comments:         Anesthesia Quick Evaluation

## 2015-12-03 NOTE — Anesthesia Procedure Notes (Signed)
Spinal Patient location during procedure: OR Staffing Anesthesiologist: Suzette Battiest Performed by: anesthesiologist  Preanesthetic Checklist Completed: patient identified, site marked, surgical consent, pre-op evaluation, timeout performed, IV checked, risks and benefits discussed and monitors and equipment checked Spinal Block Patient position: sitting Prep: Betadine Patient monitoring: heart rate, continuous pulse ox and blood pressure Approach: midline Location: L4-5 Injection technique: single-shot Needle Needle type: Pencan  Needle gauge: 24 G Needle length: 9 cm Additional Notes Expiration date of kit checked and confirmed. Patient tolerated procedure well, without complications. CSF flow before and after injection LA.

## 2015-12-03 NOTE — Interval H&P Note (Signed)
History and Physical Interval Note:  12/03/2015 8:27 AM  [name]  has presented today for surgery, with the diagnosis of DJD LEFT KNEE  The various methods of treatment have been discussed with the patient and family. After consideration of risks, benefits and other options for treatment, the patient has consented to  Procedure(s): LEFT TOTAL KNEE ARTHROPLASTY (Left) as a surgical intervention .  The patient's history has been reviewed, patient examined, no change in status, stable for surgery.  I have reviewed the patient's chart and labs.  Questions were answered to the patient's satisfaction.     Ninetta Lights

## 2015-12-03 NOTE — Progress Notes (Signed)
Orthopedic Tech Progress Note Patient Details:  [name] November 29, 1945 [id number]  Patient ID: [name], male   DOB: Sep 17, 1946, 71 y.o.   MRN: [id number] Footsie roll deleted because pt stated that he has it at home; RN notified  Hildred Priest 12/03/2015, 1:38 PM

## 2015-12-03 NOTE — Progress Notes (Signed)
Orthopedic Tech Progress Note Patient Details:  [name] 03/08/1946 [id number] Viewed order from doctor's order list CPM Left Knee CPM Left Knee: On Left Knee Flexion (Degrees): 90 Left Knee Extension (Degrees): 0 Additional Comments: trapeze bar patient hwelper   Hildred Priest 12/03/2015, 1:23 PM

## 2015-12-03 NOTE — Transfer of Care (Signed)
Immediate Anesthesia Transfer of Care Note  Patient: [name]  Procedure(s) Performed: Procedure(s): LEFT TOTAL KNEE ARTHROPLASTY (Left)  Patient Location: PACU  Anesthesia Type:MAC and Spinal  Level of Consciousness: awake, oriented and patient cooperative  Airway & Oxygen Therapy: Patient Spontanous Breathing and Patient connected to nasal cannula oxygen  Post-op Assessment: Report given to RN, Post -op Vital signs reviewed and stable and Patient moving all extremities X 4  Post vital signs: Reviewed and stable  Last Vitals:  Filed Vitals:   12/03/15 0904  BP: 133/78  Pulse: 61  Temp: 36.7 C  Resp: 18    Complications: No apparent anesthesia complications

## 2015-12-03 NOTE — Progress Notes (Signed)
Report given to elise rn as caregiver 

## 2015-12-03 NOTE — H&P (View-Only) (Signed)
TOTAL KNEE ADMISSION H&P  Patient is being admitted for left total knee arthroplasty.  Subjective:  Chief Complaint:left knee pain.  HPI: [name], 70 y.o. male, has a history of pain and functional disability in the left knee due to arthritis and has failed non-surgical conservative treatments for greater than 12 weeks to includeNSAID's and/or analgesics and corticosteriod injections.  Onset of symptoms was gradual, starting 2 years ago with gradually worsening course since that time. The patient noted no past surgery on the left knee(s).  Patient currently rates pain in the left knee(s) at 3 out of 10 with activity. Patient has worsening of pain with activity and weight bearing.  Patient has evidence of subchondral sclerosis and joint space narrowing by imaging studies. There is no active infection.  Patient Active Problem List   Diagnosis Date Noted  . DJD (degenerative joint disease) of knee 09/04/2014  . Hypercholesterolemia 05/02/2014  . Elevated blood pressure reading 07/13/2011  . Hypothyroidism 02/24/2011  . Erectile dysfunction 02/24/2011  . CAD (coronary artery disease)   . LBBB (left bundle branch block)   . Syncope    Past Medical History  Diagnosis Date  . CAD (coronary artery disease)     s/p CABG x 5 in 2010; Dr. Darcey Nora  . Syncope     Negative EP evaluation  . HTN (hypertension)   . Hyperlipidemia   . ED (erectile dysfunction)   . Hypothyroid   . LBBB (left bundle branch block)   . Cancer 2005    throat    Past Surgical History  Procedure Laterality Date  . Coronary artery bypass graft  06/06/09    x 5  . Cardiac catheterization    . 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 Incisional hernia repair    . Total knee arthroplasty Bilateral 09/04/2014    Procedure: Intra-articular injection Left knee, Excision of Chronic Pre-patella Bursa Right Knee , Right Total Knee Arthroplasty;  Surgeon: Ninetta Lights, MD;  Location: [facility];  Service: Orthopedics;  Laterality: Bilateral;     (Not in a hospital admission) No Known Allergies  Social History  Substance Use Topics  . Smoking status: Never Smoker   . Smokeless tobacco: Never Used  . Alcohol Use: No    Family History  Problem Relation Age of Onset  . Heart attack Father   . Ovarian cancer Mother      Review of Systems  Constitutional: Negative.   HENT: Negative.   Eyes: Negative.   Respiratory: Negative.   Cardiovascular: Negative.   Gastrointestinal: Negative.   Genitourinary: Negative.   Musculoskeletal: Positive for joint pain.  Skin: Negative.   Neurological: Negative.   Endo/Heme/Allergies: Negative.   Psychiatric/Behavioral: Negative.     Objective:  Physical Exam  Constitutional: He is oriented to person, place, and time. He appears well-developed and well-nourished.  HENT:  Head: Normocephalic and atraumatic.  Eyes: EOM are normal. Pupils are equal, round, and reactive to light.  Neck: Normal range of motion. Neck supple.  Cardiovascular: Normal rate and regular rhythm.   Respiratory: Effort normal and breath sounds normal.  GI: Soft. Bowel sounds are normal.  Musculoskeletal:  Examination of his left knee reveals range of motion of 0-125 degrees. Minimal patellofemoral crepitus. Minimal joint line tenderness. Negative log roll. Negative straight leg raise.   Neurological: He is alert and oriented to person, place, and time.  Skin: Skin is  warm and dry.  Psychiatric: He has a normal mood and affect. His behavior is normal. Judgment and thought content normal.    Vital signs in last 24 hours: @VSRANGES @  Labs:   Estimated body mass index is 27.50 kg/(m^2) as calculated from the following:   Height as of 06/17/15: 6\' 3"  (1.905 m).   Weight as of 06/17/15: 99.791 kg (220 lb).   Imaging Review Plain radiographs demonstrate severe degenerative joint disease of the left knee(s). The overall alignment  ismild varus. The bone quality appears to be fair for age and reported activity level.  Assessment/Plan:  End stage arthritis, left knee   The patient history, physical examination, clinical judgment of the provider and imaging studies are consistent with end stage degenerative joint disease of the left knee(s) and total knee arthroplasty is deemed medically necessary. The treatment options including medical management, injection therapy arthroscopy and arthroplasty were discussed at length. The risks and benefits of total knee arthroplasty were presented and reviewed. The risks due to aseptic loosening, infection, stiffness, patella tracking problems, thromboembolic complications and other imponderables were discussed. The patient acknowledged the explanation, agreed to proceed with the plan and consent was signed. Patient is being admitted for inpatient treatment for surgery, pain control, PT, OT, prophylactic antibiotics, VTE prophylaxis, progressive ambulation and ADL's and discharge planning. The patient is planning to be discharged home with home health services

## 2015-12-03 NOTE — Discharge Summary (Addendum)
Patient ID: CIRO AVALLONE MRN: [id number] DOB/AGE: 68-Aug-1947 68 y.o.  Admit date: 12/03/2015 Discharge date: 12/05/2015  Admission Diagnoses:  Active Problems:   DJD (degenerative joint disease) of knee   Discharge Diagnoses:  Same  Past Medical History  Diagnosis Date  . CAD (coronary artery disease)     s/p CABG x 5 in 2010; Dr. Darcey Nora  . Syncope     Negative EP evaluation  . HTN (hypertension)   . Hyperlipidemia   . ED (erectile dysfunction)   . Hypothyroid   . LBBB (left bundle branch block)   . Arthritis     knees   . Lumbar degenerative disc disease     followed by Dr. Limmie Patricia, for chiropractic care  . Cancer [facility]) 2005    throat, tx with surgery, radiation, chemotherapy    Surgeries: Procedure(s): LEFT TOTAL KNEE ARTHROPLASTY on 12/03/2015   Consultants:    Discharged Condition: Improved  Hospital Course: RANSON HOLTHAUS is an 70 y.o. male who was admitted 12/03/2015 for operative treatment of primary localized osteoarthritis left knee. Patient has severe unremitting pain that affects sleep, daily activities, and work/hobbies. After pre-op clearance the patient was taken to the operating room on 12/03/2015 and underwent  Procedure(s): LEFT TOTAL KNEE ARTHROPLASTY.  Patient with a pre-op Hb of 11.8 developed ABLA on pod#1 with a Hb of 9.6.  He is currently stable but we will continue to follow.  Patient was given perioperative antibiotics:      Anti-infectives    Start     Dose/Rate Route Frequency Ordered Stop   12/03/15 1815  ceFAZolin (ANCEF) IVPB 2 g/50 mL premix     2 g 100 mL/hr over 30 Minutes Intravenous Every 6 hours 12/03/15 1801 12/04/15 0120   12/03/15 1030  ceFAZolin (ANCEF) IVPB 2 g/50 mL premix     2 g 100 mL/hr over 30 Minutes Intravenous To ShortStay Surgical 12/02/15 1307 12/03/15 1110       Patient was given sequential compression devices, early ambulation, and chemoprophylaxis to prevent DVT.  Patient benefited maximally from hospital  stay and there were no complications.    Recent vital signs:  Patient Vitals for the past 24 hrs:  BP Temp Temp src Pulse Resp SpO2  12/05/15 0403 114/63 mmHg 98.7 F (37.1 C) Oral 71 16 95 %  12/04/15 2117 (!) 142/72 mmHg 98.9 F (37.2 C) Oral 85 17 96 %  12/04/15 1346 130/73 mmHg 98 F (36.7 C) Oral 80 17 98 %     Recent laboratory studies:   Recent Labs  12/04/15 0528 12/05/15 0536  WBC 7.0 11.9*  HGB 9.6* 9.3*  HCT 30.0* 28.6*  PLT 180 168  NA 135 141  K 4.1 4.2  CL 105 107  CO2 26 27  BUN 16 10  CREATININE 1.01 0.90  GLUCOSE 127* 139*  CALCIUM 8.1* 8.9     Discharge Medications:     Medication List    STOP taking these medications        aspirin 81 MG tablet      TAKE these medications        apixaban 2.5 MG Tabs tablet  Commonly known as:  ELIQUIS  Take 1 tab po q12 hours x 14 days following surgery to prevent blood clots     bisacodyl 5 MG EC tablet  Commonly known as:  DULCOLAX  Take 1 tablet (5 mg total) by mouth daily as needed for moderate constipation.     diazepam  2 MG tablet  Commonly known as:  VALIUM  Take 1 tablet (2 mg total) by mouth every 8 (eight) hours as needed for muscle spasms.     levothyroxine 150 MCG tablet  Commonly known as:  SYNTHROID, LEVOTHROID  Take 1 tablet (150 mcg total) by mouth daily.     metoprolol 100 MG tablet  Commonly known as:  LOPRESSOR  TAKE HALF TABLET BY MOUTH TWICE DAILY     ondansetron 4 MG tablet  Commonly known as:  ZOFRAN  Take 1 tablet (4 mg total) by mouth every 8 (eight) hours as needed for nausea or vomiting.     oxyCODONE-acetaminophen 5-325 MG tablet  Commonly known as:  ROXICET  Take 1-2 tablets by mouth every 4 (four) hours as needed.     tadalafil 20 MG tablet  Commonly known as:  CIALIS  Take 20 mg by mouth daily as needed for erectile dysfunction.        Diagnostic Studies: Dg Knee Left Port  12/03/2015  CLINICAL DATA:  Status post total left knee replacement EXAM:  PORTABLE LEFT KNEE - 1-2 VIEW COMPARISON:  None. FINDINGS: Tricompartmental knee prosthesis with components in anticipated position. There is air and fluid over the knee joint postoperatively. IMPRESSION: Anticipated postoperative appearance Electronically Signed   By: Skipper Cliche M.D.   On: 12/03/2015 13:19    Disposition: 06-Home-Health Care Svc    Follow-up Information    Follow up with Ninetta Lights, MD. Schedule an appointment as soon as possible for a visit in 2 weeks.   Specialty:  Orthopedic Surgery   Contact information:   [address] [place] [address] [phone]        Signed: Fannie Knee 12/05/2015, 7:42 AM

## 2015-12-04 ENCOUNTER — Encounter (HOSPITAL_COMMUNITY): Payer: Self-pay | Admitting: Orthopedic Surgery

## 2015-12-04 LAB — CBC
HCT: 30 % — ABNORMAL LOW (ref 39.0–52.0)
Hemoglobin: 9.6 g/dL — ABNORMAL LOW (ref 13.0–17.0)
MCH: 27.3 pg (ref 26.0–34.0)
MCHC: 32 g/dL (ref 30.0–36.0)
MCV: 85.2 fL (ref 78.0–100.0)
PLATELETS: 180 10*3/uL (ref 150–400)
RBC: 3.52 MIL/uL — ABNORMAL LOW (ref 4.22–5.81)
RDW: 13 % (ref 11.5–15.5)
WBC: 7 10*3/uL (ref 4.0–10.5)

## 2015-12-04 LAB — BASIC METABOLIC PANEL
ANION GAP: 4 — AB (ref 5–15)
BUN: 16 mg/dL (ref 6–20)
CALCIUM: 8.1 mg/dL — AB (ref 8.9–10.3)
CO2: 26 mmol/L (ref 22–32)
Chloride: 105 mmol/L (ref 101–111)
Creatinine, Ser: 1.01 mg/dL (ref 0.61–1.24)
GLUCOSE: 127 mg/dL — AB (ref 65–99)
Potassium: 4.1 mmol/L (ref 3.5–5.1)
SODIUM: 135 mmol/L (ref 135–145)

## 2015-12-04 MED ORDER — METOPROLOL TARTRATE 50 MG PO TABS
50.0000 mg | ORAL_TABLET | Freq: Two times a day (BID) | ORAL | Status: DC
  Administered 2015-12-04 – 2015-12-05 (×2): 50 mg via ORAL
  Filled 2015-12-04 (×3): qty 1

## 2015-12-04 NOTE — Evaluation (Signed)
Occupational Therapy Evaluation and Discharge Patient Details Name: [name] MRN: [id number] DOB: 21-Apr-1946 Today's Date: 12/04/2015    History of Present Illness 70 yo admitted for L TKA. PMHx: R TKA, CAD, LBBB, throat CA   Clinical Impression   Pt requiring min assist for LB ADL, otherwise performing at a supervision level in ADL and mobility. Pt will have supportive wife to assist 24 hours at home, does not need education in adaptive equipment. All education completed with no further concerns voiced by pt or wife.      Follow Up Recommendations  No OT follow up    Equipment Recommendations  None recommended by OT    Recommendations for Other Services       Precautions / Restrictions Precautions Precautions: Knee Restrictions LLE Weight Bearing: Weight bearing as tolerated      Mobility Bed Mobility Overal bed mobility: Modified Independent                Transfers Overall transfer level: Modified independent Equipment used: Rolling walker (2 wheeled)                  Balance                                            ADL Overall ADL's : Needs assistance/impaired Eating/Feeding: Independent;Sitting   Grooming: Supervision/safety;Standing   Upper Body Bathing: Set up;Sitting   Lower Body Bathing: Minimal assistance;Sit to/from stand Lower Body Bathing Details (indicate cue type and reason): recommended long bath sponge Upper Body Dressing : Set up;Sitting   Lower Body Dressing: Minimal assistance;Sit to/from stand Lower Body Dressing Details (indicate cue type and reason): will rely on wife Toilet Transfer: Supervision/safety;Ambulation   Toileting- Clothing Manipulation and Hygiene: Modified independent     Tub/Shower Transfer Details (indicate cue type and reason): educated pt in technique for shower transfer, wife will supervise Functional mobility during ADLs: Supervision/safety;Rolling walker       Vision      Perception     Praxis      Pertinent Vitals/Pain Pain Assessment: Faces Pain Score: 3  Faces Pain Scale: Hurts a little bit Pain Location: L knee Pain Descriptors / Indicators: Guarding;Grimacing;Sore Pain Intervention(s): Monitored during session;Repositioned;Premedicated before session     Hand Dominance Right   Extremity/Trunk Assessment Upper Extremity Assessment Upper Extremity Assessment: Overall WFL for tasks assessed   Lower Extremity Assessment Lower Extremity Assessment: Defer to PT evaluation   Cervical / Trunk Assessment Cervical / Trunk Assessment: Normal   Communication Communication Communication: No difficulties   Cognition Arousal/Alertness: Awake/alert Behavior During Therapy: WFL for tasks assessed/performed Overall Cognitive Status: Within Functional Limits for tasks assessed                     General Comments       Exercises       Shoulder Instructions      Home Living Family/patient expects to be discharged to:: Private residence Living Arrangements: Spouse/significant other Available Help at Discharge: Family;Available 24 hours/day Type of Home: Apartment Home Access: Stairs to enter Entrance Stairs-Number of Steps: 1   Home Layout: One level     Bathroom Shower/Tub: Occupational psychologist: Standard     Home Equipment: Environmental consultant - 2 wheels;Cane - single point;Bedside commode;Shower seat - built in  Prior Functioning/Environment Level of Independence: Independent             OT Diagnosis: Acute pain;Generalized weakness   OT Problem List:     OT Treatment/Interventions:      OT Goals(Current goals can be found in the care plan section) Acute Rehab OT Goals 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  OT Frequency:     Barriers to D/C:            Co-evaluation              End of Session   Activity Tolerance: Patient tolerated treatment well Patient left: in bed;with  family/visitor present;with call bell/phone within reach (EOB)   Time: [id number] OT Time Calculation (min): 18 min Charges:  OT General Charges $OT Visit: 1 Procedure OT Evaluation $OT Eval Low Complexity: 1 Procedure G-Codes:    Malka So 12/04/2015, 4:05 PM  [phone]

## 2015-12-04 NOTE — Progress Notes (Signed)
Physical Therapy Treatment Patient Details Name: PRINCETIN BIHL MRN: [id number] DOB: 1946-03-05 Today's Date: 2015/12/27    History of Present Illness 70 yo admitted for L TKA. PMHx: R TKA, CAD, LBBB, throat CA    PT Comments    Pt remains pleasant and moving well with good pain control. Pt educated for HEP, CPM and gait with all performed. Pt with CPM 0-75 end of session. Encouraged OOB for meals. Will continue to follow.  Follow Up Recommendations  Home health PT     Equipment Recommendations       Recommendations for Other Services       Precautions / Restrictions Precautions Precautions: Knee Restrictions LLE Weight Bearing: Weight bearing as tolerated    Mobility  Bed Mobility Overal bed mobility: Modified Independent                Transfers Overall transfer level: Modified independent                  Ambulation/Gait Ambulation/Gait assistance: Supervision Ambulation Distance (Feet): 200 Feet Assistive device: Rolling walker (2 wheeled) Gait Pattern/deviations: Step-through pattern;Decreased stride length;Decreased dorsiflexion - left   Gait velocity interpretation: Below normal speed for age/gender General Gait Details: cues for heel strike and posture   Stairs Stairs: Yes Stairs assistance: Supervision Stair Management: Backwards;Step to pattern;With walker Number of Stairs: 1 General stair comments: cues for sequence  Wheelchair Mobility    Modified Rankin (Stroke Patients Only)       Balance                                    Cognition Arousal/Alertness: Awake/alert Behavior During Therapy: WFL for tasks assessed/performed Overall Cognitive Status: Within Functional Limits for tasks assessed                      Exercises Total Joint Exercises Quad Sets: AROM;Left;Supine;15 reps Short Arc QuadSinclair Ship;Left;10 reps;Seated Heel Slides: AROM;Left;Supine;15 reps Hip ABduction/ADduction:  Left;Seated;AROM;15 reps Straight Leg Raises: AROM;Left;10 reps;Supine Goniometric ROM: 1-50    General Comments        Pertinent Vitals/Pain Pain Score: 3  Pain Location: left knee Pain Descriptors / Indicators: Aching Pain Intervention(s): Limited activity within patient's tolerance;Monitored during session;Premedicated before session;Repositioned    Home Living                      Prior Function            PT Goals (current goals can now be found in the care plan section) Progress towards PT goals: Progressing toward goals    Frequency       PT Plan Current plan remains appropriate    Co-evaluation             End of Session   Activity Tolerance: Patient tolerated treatment well Patient left: in bed;in CPM;with call bell/phone within reach;with family/visitor present     Time: [id number] PT Time Calculation (min) (ACUTE ONLY): 25 min  Charges:  $Gait Training: 8-22 mins $Therapeutic Exercise: 8-22 mins                    G Codes:      Melford Aase 2015-12-27, 1:43 PM Elwyn Reach, [place]

## 2015-12-04 NOTE — Op Note (Signed)
NAME:  LABON, FONT NO.:  [id number]  MEDICAL RECORD NO.:  [id number]  LOCATION:  5N06C                        FACILITY:  [facility]  PHYSICIAN:  Ninetta Lights, M.D. DATE OF BIRTH:  1946-05-18  DATE OF PROCEDURE:  12/03/2015 DATE OF DISCHARGE:                              OPERATIVE REPORT   PREOPERATIVE DIAGNOSIS:  End-stage arthritis, left knee. Primary generalized.  POSTOPERATIVE DIAGNOSIS:  End-stage arthritis, left knee.  Primary generalized.  PROCEDURE:  Left knee modified minimally-invasive total knee replacement Stryker triathlon prosthesis.  Cemented pegged posterior stabilized #7 femoral component.  Cemented #7 tibial component, 11 mm PS insert. Cemented resurfacing 38-mm patellar component.  SURGEON:  Ninetta Lights, M.D.  ASSISTANT:  Elmyra Ricks, PA, present throughout the entire case and necessary for timely completion of procedure.  ANESTHESIA:  Spinal.  BLOOD LOSS:  Minimal.  SPECIMENS:  None.  COUNTS:  None.  COMPLICATIONS:  None.  DRESSINGS:  Soft compressive knee immobilizer.  TOURNIQUET TIME:  45 minutes.  DESCRIPTION OF PROCEDURE:  The patient was brought to operating room, placed on the operating table in supine position.  After adequate anesthesia had been obtained, tourniquet applied, prepped and draped in usual sterile fashion.  Exsanguinated with elevation of Esmarch. Tourniquet inflated to 350 mmHg.  Straight incision above the patella down to tibial tubercle.  Skin and subcutaneous tissue divided.  Medial arthrotomy, vastus splitting, preserving quad tendon.  Distal femur exposed.  Flexible intramedullary guide.  An 8-mm resection 5 degrees of valgus.  Using epicondylar axis, the femur was sized, cut, and fitted for a pegged posterior stabilized #7 component.  Proximal tibial resection with extramedullary guide.  Size for #7 component.  Patella exposed.  Posterior 10 mm removed.  Drilled, sized, and fitted for  a 38- mm component.  Trials put in place.  With 11 mm insert, pleased with balancing, alignment motion by mechanical axis of patellar tracking. Tibia was marked for rotation and reamed.  All trials removed.  Copious irrigation with pulse irrigating device.  Cement prepared, placed on all components, firmly seated.  Polyethylene attached to tibia, knee reduced.  Patella held with clamp.  Once cement hardened, the knee was irrigated again.  Soft tissue was injected with Exparel.  Arthrotomy closed with #1 Vicryl.  Skin and subcutaneous tissue with subcuticular closure.  Margins were injected with Marcaine.  Sterile compressive dressing applied.  Tourniquet deflated and removed.  Knee immobilizer applied.  Anesthesia reversed. Brought to the recovery room.  Tolerated the surgery well.  No complications.     Ninetta Lights, M.D.     DFM/MEDQ  D:  12/04/2015  T:  12/04/2015  Job:  [id number]

## 2015-12-04 NOTE — Progress Notes (Signed)
Subjective: 1 Day Post-Op Procedure(s) (LRB): LEFT TOTAL KNEE ARTHROPLASTY (Left) Patient reports pain as mild.  Main issue has been acute urinary retention.  In/out cath last night which helped relieve abdominal pressure.  Pt has h/x of urinary incontinence x 1 weeks this past summer following his right TKR.  Otherwise, no nausea/vomiting, lightheadedness/dizziness, chest pain/sob.  Positive flatus/no bm.  Tolerating diet.  Objective: Vital signs in last 24 hours: Temp:  [97.6 F (36.4 C)-98.7 F (37.1 C)] 97.8 F (36.6 C) (01/12 0523) Pulse Rate:  [32-75] 73 (01/12 0523) Resp:  [11-20] 15 (01/12 0523) BP: (95-139)/(58-94) 95/58 mmHg (01/12 0523) SpO2:  [89 %-100 %] 95 % (01/12 0523) Weight:  [97.796 kg (215 lb 9.6 oz)] 97.796 kg (215 lb 9.6 oz) (01/11 0904)  Intake/Output from previous day: 01/11 0701 - 01/12 0700 In: 2571.7 [P.O.:250; I.V.:2271.7; IV Piggyback:50] Out: 1060 [Urine:1030; Blood:30] Intake/Output this shift: Total I/O In: 1071.7 [I.V.:1071.7] Out: 1030 [Urine:1030]   Recent Labs  12/04/15 0528  HGB 9.6*    Recent Labs  12/04/15 0528  WBC 7.0  RBC 3.52*  HCT 30.0*  PLT 180   No results for input(s): NA, K, CL, CO2, BUN, CREATININE, GLUCOSE, CALCIUM in the last 72 hours. No results for input(s): LABPT, INR in the last 72 hours.  Neurologically intact Neurovascular intact Sensation intact distally Intact pulses distally Dorsiflexion/Plantar flexion intact Compartment soft  Negative homans bilaterally  Assessment/Plan: 1 Day Post-Op Procedure(s) (LRB): LEFT TOTAL KNEE ARTHROPLASTY (Left) Advance diet Up with therapy D/C IV fluids Discharge home with home health tomorrow WBAT LLE ABLA-mild and stable Acute urinary retention-will continue to watch Dry dressing change prn  Fannie Knee 12/04/2015, 6:48 AM

## 2015-12-04 NOTE — Progress Notes (Signed)
Utilization review completed. Mckinnon Glick, RN, BSN. 

## 2015-12-04 NOTE — Discharge Instructions (Signed)
INSTRUCTIONS AFTER JOINT REPLACEMENT   o Remove items at home which could result in a fall. This includes throw rugs or furniture in walking pathways o ICE to the affected joint every three hours while awake for 30 minutes at a time, for at least the first 3-5 days, and then as needed for pain and swelling.  Continue to use ice for pain and swelling. You may notice swelling that will progress down to the foot and ankle.  This is normal after surgery.  Elevate your leg when you are not up walking on it.   o Continue to use the breathing machine you got in the hospital (incentive spirometer) which will help keep your temperature down.  It is common for your temperature to cycle up and down following surgery, especially at night when you are not up moving around and exerting yourself.  The breathing machine keeps your lungs expanded and your temperature down.  BLOOD THINNER: TAKE ELIQUIS AS PRESCRIBED.  ONCE FINISHED WITH THIS, TAKE ASPIRIN 325 MG ONE TABLET ONCE DAILY FOR THE NEXT TWO WEEKS.     DIET:  As you were doing prior to hospitalization, we recommend a well-balanced diet.  DRESSING / WOUND CARE / SHOWERING  You may change your dressing 3-5 days after surgery.  Then change the dressing every day with sterile gauze.  Please use good hand washing techniques before changing the dressing.  Do not use any lotions or creams on the incision until instructed by your surgeon. and You may shower 3 days after surgery, but keep the wounds dry during showering.  You may use an occlusive plastic wrap (Press'n Seal for example), NO SOAKING/SUBMERGING IN THE BATHTUB.  If the bandage gets wet, change with a clean dry gauze.  If the incision gets wet, pat the wound dry with a clean towel.  ACTIVITY  o Increase activity slowly as tolerated, but follow the weight bearing instructions below.   o No driving for 6 weeks or until further direction given by your physician.  You cannot drive while taking narcotics.   o No lifting or carrying greater than 10 lbs. until further directed by your surgeon. o Avoid periods of inactivity such as sitting longer than an hour when not asleep. This helps prevent blood clots.  o You may return to work once you are authorized by your doctor.     WEIGHT BEARING   Weight bearing as tolerated with assist device (walker, cane, etc) as directed, use it as long as suggested by your surgeon or therapist, typically at least 4-6 weeks.   EXERCISES  Results after joint replacement surgery are often greatly improved when you follow the exercise, range of motion and muscle strengthening exercises prescribed by your doctor. Safety measures are also important to protect the joint from further injury. Any time any of these exercises cause you to have increased pain or swelling, decrease what you are doing until you are comfortable again and then slowly increase them. If you have problems or questions, call your caregiver or physical therapist for advice.   Rehabilitation is important following a joint replacement. After just a few days of immobilization, the muscles of the leg can become weakened and shrink (atrophy).  These exercises are designed to build up the tone and strength of the thigh and leg muscles and to improve motion. Often times heat used for twenty to thirty minutes before working out will loosen up your tissues and help with improving the range of motion but  do not use heat for the first two weeks following surgery (sometimes heat can increase post-operative swelling).   These exercises can be done on a training (exercise) mat, on the floor, on a table or on a bed. Use whatever works the best and is most comfortable for you.    Use music or television while you are exercising so that the exercises are a pleasant break in your day. This will make your life better with the exercises acting as a break in your routine that you can look forward to.   Perform all exercises  about fifteen times, three times per day or as directed.  You should exercise both the operative leg and the other leg as well.  Exercises include:    Quad Sets - Tighten up the muscle on the front of the thigh (Quad) and hold for 5-10 seconds.    Straight Leg Raises - With your knee straight (if you were given a brace, keep it on), lift the leg to 60 degrees, hold for 3 seconds, and slowly lower the leg.  Perform this exercise against resistance later as your leg gets stronger.   Leg Slides: Lying on your back, slowly slide your foot toward your buttocks, bending your knee up off the floor (only go as far as is comfortable). Then slowly slide your foot back down until your leg is flat on the floor again.   Angel Wings: Lying on your back spread your legs to the side as far apart as you can without causing discomfort.   Hamstring Strength:  Lying on your back, push your heel against the floor with your leg straight by tightening up the muscles of your buttocks.  Repeat, but this time bend your knee to a comfortable angle, and push your heel against the floor.  You may put a pillow under the heel to make it more comfortable if necessary.   A rehabilitation program following joint replacement surgery can speed recovery and prevent re-injury in the future due to weakened muscles. Contact your doctor or a physical therapist for more information on knee rehabilitation.    CONSTIPATION  Constipation is defined medically as fewer than three stools per week and severe constipation as less than one stool per week.  Even if you have a regular bowel pattern at home, your normal regimen is likely to be disrupted due to multiple reasons following surgery.  Combination of anesthesia, postoperative narcotics, change in appetite and fluid intake all can affect your bowels.   YOU MUST use at least one of the following options; they are listed in order of increasing strength to get the job done.  They are all  available over the counter, and you may need to use some, POSSIBLY even all of these options:    Drink plenty of fluids (prune juice may be helpful) and high fiber foods Colace 100 mg by mouth twice a day  Senokot for constipation as directed and as needed Dulcolax (bisacodyl), take with full glass of water  Miralax (polyethylene glycol) once or twice a day as needed.  If you have tried all these things and are unable to have a bowel movement in the first 3-4 days after surgery call either your surgeon or your primary doctor.    If you experience loose stools or diarrhea, hold the medications until you stool forms back up.  If your symptoms do not get better within 1 week or if they get worse, check with your doctor.  If  you experience "the worst abdominal pain ever" or develop nausea or vomiting, please contact the office immediately for further recommendations for treatment.   ITCHING:  If you experience itching with your medications, try taking only a single pain pill, or even half a pain pill at a time.  You can also use Benadryl over the counter for itching or also to help with sleep.   TED HOSE STOCKINGS:  Use stockings on both legs until for at least 2 weeks or as directed by physician office. They may be removed at night for sleeping.  MEDICATIONS:  See your medication summary on the After Visit Summary that nursing will review with you.  You may have some home medications which will be placed on hold until you complete the course of blood thinner medication.  It is important for you to complete the blood thinner medication as prescribed.  PRECAUTIONS:  If you experience chest pain or shortness of breath - call 911 immediately for transfer to the hospital emergency department.   If you develop a fever greater that 101 F, purulent drainage from wound, increased redness or drainage from wound, foul odor from the wound/dressing, or calf pain - CONTACT YOUR SURGEON.                                                    FOLLOW-UP APPOINTMENTS:  If you do not already have a post-op appointment, please call the office for an appointment to be seen by your surgeon.  Guidelines for how soon to be seen are listed in your After Visit Summary, but are typically between 1-4 weeks after surgery.  OTHER INSTRUCTIONS:   Knee Replacement:  Do not place pillow under knee, focus on keeping the knee straight while resting. CPM instructions: 0-90 degrees, 2 hours in the morning, 2 hours in the afternoon, and 2 hours in the evening. Place foam block, curve side up under heel at all times except when in CPM or when walking.  DO NOT modify, tear, cut, or change the foam block in any way.  MAKE SURE YOU:   Understand these instructions.   Get help right away if you are not doing well or get worse.    Thank you for letting us be a part of your medical care team.  It is a privilege we respect greatly.  We hope these instructions will help you stay on track for a fast and full recovery!      Information on my medicine - ELIQUIS (apixaban)  This medication education was reviewed with me or my healthcare representative as part of my discharge preparation.  The pharmacist that spoke with me during my hospital stay was:  Wayland Salinas, [facility]  Why was Eliquis prescribed for you? Eliquis was prescribed for you to reduce the risk of blood clots forming after orthopedic surgery.    What do You need to know about Eliquis? Take your Eliquis TWICE DAILY - one tablet in the morning and one tablet in the evening with or without food.  It would be best to take the dose about the same time each day.  If you have difficulty swallowing the tablet whole please discuss with your pharmacist how to take the medication safely.  Take Eliquis exactly as prescribed by your doctor and DO NOT stop taking Eliquis without talking to  the doctor who prescribed the medication.  Stopping without other  medication to take the place of Eliquis may increase your risk of developing a clot.  After discharge, you should have regular check-up appointments with your healthcare provider that is prescribing your Eliquis.  What do you do if you miss a dose? If a dose of ELIQUIS is not taken at the scheduled time, take it as soon as possible on the same day and twice-daily administration should be resumed.  The dose should not be doubled to make up for a missed dose.  Do not take more than one tablet of ELIQUIS at the same time.  Important Safety Information A possible side effect of Eliquis is bleeding. You should call your healthcare provider right away if you experience any of the following: ? Bleeding from an injury or your nose that does not stop. ? Unusual colored urine (red or dark brown) or unusual colored stools (red or black). ? Unusual bruising for unknown reasons. ? A serious fall or if you hit your head (even if there is no bleeding).  Some medicines may interact with Eliquis and might increase your risk of bleeding or clotting while on Eliquis. To help avoid this, consult your healthcare provider or pharmacist prior to using any new prescription or non-prescription medications, including herbals, vitamins, non-steroidal anti-inflammatory drugs (NSAIDs) and supplements.  This website has more information on Eliquis (apixaban): http://www.eliquis.com/eliquis/home

## 2015-12-04 NOTE — Anesthesia Postprocedure Evaluation (Signed)
Anesthesia Post Note  Patient: [name]  Procedure(s) Performed: Procedure(s) (LRB): LEFT TOTAL KNEE ARTHROPLASTY (Left)  Patient location during evaluation: PACU Anesthesia Type: Spinal and MAC Level of consciousness: awake and alert Pain management: pain level controlled Vital Signs Assessment: post-procedure vital signs reviewed and stable Respiratory status: spontaneous breathing Cardiovascular status: blood pressure returned to baseline Anesthetic complications: no    Last Vitals:  Filed Vitals:   12/04/15 0523 12/04/15 1346  BP: 95/58 130/73  Pulse: 73 80  Temp: 36.6 C 36.7 C  Resp: 15 17    Last Pain:  Filed Vitals:   12/04/15 1348  PainSc: 2                  Tiajuana Amass

## 2015-12-04 NOTE — Evaluation (Signed)
Physical Therapy Evaluation Patient Details Name: [name] MRN: [id number] DOB: 1946/03/25 Today's Date: 12/04/2015   History of Present Illness  70 yo admitted for L TKA. PMHx: R TKA, CAD, LBBB, throat CA  Clinical Impression  Pt very pleasant and moving well. Decreased quad strength and unable to perform SLR at this time. Pt with decreased ROM, transfers and gait who will benefit from acute therapy to maximize mobility, function and gait to decrease burden of care. Pt educated for CPM use, heel elevation, HEP, transfers, gait and safety.     Follow Up Recommendations Home health PT    Equipment Recommendations  None recommended by PT    Recommendations for Other Services       Precautions / Restrictions Precautions Precautions: Knee Restrictions LLE Weight Bearing: Weight bearing as tolerated      Mobility  Bed Mobility Overal bed mobility: Modified Independent                Transfers Overall transfer level: Needs assistance   Transfers: Sit to/from Stand Sit to Stand: Supervision         General transfer comment: cues for hand placement, sequence and safety  Ambulation/Gait Ambulation/Gait assistance: Supervision Ambulation Distance (Feet): 175 Feet Assistive device: Rolling walker (2 wheeled) Gait Pattern/deviations: Step-through pattern;Decreased stride length;Decreased dorsiflexion - left   Gait velocity interpretation: Below normal speed for age/gender General Gait Details: cues for heel strike and posture, utilized KI due to lack of SLR  Stairs            Wheelchair Mobility    Modified Rankin (Stroke Patients Only)       Balance                                             Pertinent Vitals/Pain Pain Assessment: 0-10 Pain Score: 2  Pain Location: left knee Pain Descriptors / Indicators: Aching Pain Intervention(s): Limited activity within patient's tolerance;Monitored during session;Premedicated before  session;Repositioned;Ice applied    Home Living Family/patient expects to be discharged to:: Private residence Living Arrangements: Spouse/significant other Available Help at Discharge: Family Type of Home: House Home Access: Stairs to enter   Technical brewer of Steps: 1 Home Layout: One level Home Equipment: Environmental consultant - 2 wheels;Cane - single point;Bedside commode      Prior Function Level of Independence: Independent               Hand Dominance        Extremity/Trunk Assessment   Upper Extremity Assessment: Overall WFL for tasks assessed           Lower Extremity Assessment: LLE deficits/detail   LLE Deficits / Details: decreased strength and ROM post op  Cervical / Trunk Assessment: Normal  Communication   Communication: No difficulties  Cognition Arousal/Alertness: Awake/alert Behavior During Therapy: WFL for tasks assessed/performed Overall Cognitive Status: Within Functional Limits for tasks assessed                      General Comments      Exercises Total Joint Exercises Quad Sets: AROM;Left;10 reps;Supine Heel Slides: AROM;Left;10 reps;Supine Hip ABduction/ADduction: AAROM;Left;10 reps;Seated Straight Leg Raises: AAROM;Left;5 reps;Supine      Assessment/Plan    PT Assessment Patient needs continued PT services  PT Diagnosis Difficulty walking;Acute pain   PT Problem List Decreased strength;Decreased range of motion;Decreased activity tolerance;Decreased  mobility;Pain  PT Treatment Interventions Gait training;DME instruction;Stair training;Functional mobility training;Therapeutic activities;Therapeutic exercise;Patient/family education   PT Goals (Current goals can be found in the Care Plan section) Acute Rehab PT Goals 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 PT Goal Formulation: With patient Time For Goal Achievement: 12/11/15 Potential to Achieve Goals: Good    Frequency 7X/week   Barriers to discharge         Co-evaluation               End of Session Equipment Utilized During Treatment: Gait belt;Left knee immobilizer Activity Tolerance: Patient tolerated treatment well Patient left: in chair;with call bell/phone within reach Nurse Communication: Mobility status;Weight bearing status         Time: 0756-0820 PT Time Calculation (min) (ACUTE ONLY): 24 min   Charges:   PT Evaluation $PT Eval Moderate Complexity: 1 Procedure PT Treatments $Gait Training: 8-22 mins   PT G CodesMelford Aase 12/04/2015, 8:57 AM Elwyn Reach, [place]

## 2015-12-05 LAB — CBC
HEMATOCRIT: 28.6 % — AB (ref 39.0–52.0)
HEMOGLOBIN: 9.3 g/dL — AB (ref 13.0–17.0)
MCH: 27.6 pg (ref 26.0–34.0)
MCHC: 32.5 g/dL (ref 30.0–36.0)
MCV: 84.9 fL (ref 78.0–100.0)
Platelets: 168 10*3/uL (ref 150–400)
RBC: 3.37 MIL/uL — ABNORMAL LOW (ref 4.22–5.81)
RDW: 13.1 % (ref 11.5–15.5)
WBC: 11.9 10*3/uL — ABNORMAL HIGH (ref 4.0–10.5)

## 2015-12-05 LAB — BASIC METABOLIC PANEL
Anion gap: 7 (ref 5–15)
BUN: 10 mg/dL (ref 6–20)
CHLORIDE: 107 mmol/L (ref 101–111)
CO2: 27 mmol/L (ref 22–32)
CREATININE: 0.9 mg/dL (ref 0.61–1.24)
Calcium: 8.9 mg/dL (ref 8.9–10.3)
GFR calc Af Amer: >60 mL/min (ref >60)
GFR calc non Af Amer: >60 mL/min (ref >60)
GLUCOSE: 139 mg/dL — AB (ref 65–99)
Potassium: 4.2 mmol/L (ref 3.5–5.1)
Sodium: 141 mmol/L (ref 135–145)

## 2015-12-05 NOTE — Progress Notes (Signed)
Subjective: 2 Days Post-Op Procedure(s) (LRB): LEFT TOTAL KNEE ARTHROPLASTY (Left) Patient reports pain as mild.  Patient reports an episode of vomiting yesterday after lunch.  No nausea/vomiting since then.  No lightheadedness/dizziness, chest pain/sob.  Objective: Vital signs in last 24 hours: Temp:  [98 F (36.7 C)-98.9 F (37.2 C)] 98.7 F (37.1 C) (01/13 0403) Pulse Rate:  [71-85] 71 (01/13 0403) Resp:  [16-17] 16 (01/13 0403) BP: (114-142)/(63-73) 114/63 mmHg (01/13 0403) SpO2:  [95 %-98 %] 95 % (01/13 0403)  Intake/Output from previous day: 01/12 0701 - 01/13 0700 In: 240 [P.O.:240] Out: -  Intake/Output this shift:     Recent Labs  12/04/15 0528 12/05/15 0536  HGB 9.6* 9.3*    Recent Labs  12/04/15 0528 12/05/15 0536  WBC 7.0 11.9*  RBC 3.52* 3.37*  HCT 30.0* 28.6*  PLT 180 168    Recent Labs  12/04/15 0528 12/05/15 0536  NA 135 141  K 4.1 4.2  CL 105 107  CO2 26 27  BUN 16 10  CREATININE 1.01 0.90  GLUCOSE 127* 139*  CALCIUM 8.1* 8.9   No results for input(s): LABPT, INR in the last 72 hours.  Neurologically intact Neurovascular intact Sensation intact distally Intact pulses distally Dorsiflexion/Plantar flexion intact Incision: dressing C/D/I No cellulitis present Compartment soft Dressing changed by me today   Assessment/Plan: 2 Days Post-Op Procedure(s) (LRB): LEFT TOTAL KNEE ARTHROPLASTY (Left) Advance diet Up with therapy Discharge home with home health after first session of PT WBAT LLE ABLA-mild and stable Dry dressing change prn  Fannie Knee 12/05/2015, 7:42 AM

## 2015-12-05 NOTE — Progress Notes (Signed)
Physical Therapy Treatment Patient Details Name: [name] MRN: [id number] DOB: Jul 05, 1946 Today's Date: 12/05/2015    History of Present Illness 70 yo admitted for L TKA. PMHx: R TKA, CAD, LBBB, throat CA    PT Comments    Patient is making good progress with PT.  From a mobility standpoint anticipate patient will be ready for DC home when medically ready.     Follow Up Recommendations  Home health PT     Equipment Recommendations  None recommended by PT    Recommendations for Other Services       Precautions / Restrictions Precautions Precautions: Knee Restrictions Weight Bearing Restrictions: Yes LLE Weight Bearing: Weight bearing as tolerated    Mobility  Bed Mobility                  Transfers Overall transfer level: Needs assistance Equipment used: Rolling walker (2 wheeled) Transfers: Sit to/from Stand Sit to Stand: Supervision         General transfer comment: supervision for safety; pt with carry over of safe hand placement and technique  Ambulation/Gait Ambulation/Gait assistance: Supervision Ambulation Distance (Feet): 200 Feet Assistive device: Rolling walker (2 wheeled) Gait Pattern/deviations: Step-through pattern;Decreased dorsiflexion - left;Trunk flexed     General Gait Details: vc for upright posture, position of RW, and facilitation of L heel strike   Stairs            Wheelchair Mobility    Modified Rankin (Stroke Patients Only)       Balance                                    Cognition Arousal/Alertness: Awake/alert Behavior During Therapy: WFL for tasks assessed/performed Overall Cognitive Status: Within Functional Limits for tasks assessed                      Exercises Total Joint Exercises Quad Sets: AROM;Left;15 reps;Seated Heel Slides: AROM;Left;15 reps;Seated Straight Leg Raises: AROM;Left;10 reps;Seated Goniometric ROM: 0-90    General Comments        Pertinent  Vitals/Pain Pain Assessment: Faces Faces Pain Scale: Hurts little more (with L knee flexion) Pain Location: L knee Pain Descriptors / Indicators: Sore Pain Intervention(s): Monitored during session;Premedicated before session;Repositioned    Home Living                      Prior Function            PT Goals (current goals can now be found in the care plan section) Acute Rehab PT Goals Patient Stated Goal: go home Progress towards PT goals: Progressing toward goals    Frequency  7X/week    PT Plan Current plan remains appropriate    Co-evaluation             End of Session Equipment Utilized During Treatment: Gait belt Activity Tolerance: Patient tolerated treatment well Patient left: in chair;with call bell/phone within reach     Time: [id number] PT Time Calculation (min) (ACUTE ONLY): 18 min  Charges:  $Gait Training: 8-22 mins                    G Codes:      Salina April, PTA Pager: [phone]   12/05/2015, 12:24 PM

## 2015-12-16 DIAGNOSIS — Z96652 Presence of left artificial knee joint: Secondary | ICD-10-CM | POA: Diagnosis not present

## 2015-12-16 DIAGNOSIS — M25662 Stiffness of left knee, not elsewhere classified: Secondary | ICD-10-CM | POA: Diagnosis not present

## 2015-12-16 DIAGNOSIS — M6281 Muscle weakness (generalized): Secondary | ICD-10-CM | POA: Diagnosis not present

## 2015-12-18 DIAGNOSIS — M6281 Muscle weakness (generalized): Secondary | ICD-10-CM | POA: Diagnosis not present

## 2015-12-18 DIAGNOSIS — M25662 Stiffness of left knee, not elsewhere classified: Secondary | ICD-10-CM | POA: Diagnosis not present

## 2015-12-18 DIAGNOSIS — Z96652 Presence of left artificial knee joint: Secondary | ICD-10-CM | POA: Diagnosis not present

## 2015-12-23 DIAGNOSIS — M6281 Muscle weakness (generalized): Secondary | ICD-10-CM | POA: Diagnosis not present

## 2015-12-23 DIAGNOSIS — Z96652 Presence of left artificial knee joint: Secondary | ICD-10-CM | POA: Diagnosis not present

## 2015-12-23 DIAGNOSIS — M25662 Stiffness of left knee, not elsewhere classified: Secondary | ICD-10-CM | POA: Diagnosis not present

## 2015-12-29 ENCOUNTER — Encounter: Payer: Self-pay | Admitting: Gastroenterology

## 2015-12-30 DIAGNOSIS — M25662 Stiffness of left knee, not elsewhere classified: Secondary | ICD-10-CM | POA: Diagnosis not present

## 2015-12-30 DIAGNOSIS — M6281 Muscle weakness (generalized): Secondary | ICD-10-CM | POA: Diagnosis not present

## 2015-12-30 DIAGNOSIS — Z96652 Presence of left artificial knee joint: Secondary | ICD-10-CM | POA: Diagnosis not present

## 2016-01-01 DIAGNOSIS — Z96652 Presence of left artificial knee joint: Secondary | ICD-10-CM | POA: Diagnosis not present

## 2016-01-01 DIAGNOSIS — M5431 Sciatica, right side: Secondary | ICD-10-CM | POA: Diagnosis not present

## 2016-01-01 DIAGNOSIS — M6281 Muscle weakness (generalized): Secondary | ICD-10-CM | POA: Diagnosis not present

## 2016-01-01 DIAGNOSIS — M9903 Segmental and somatic dysfunction of lumbar region: Secondary | ICD-10-CM | POA: Diagnosis not present

## 2016-01-01 DIAGNOSIS — M4716 Other spondylosis with myelopathy, lumbar region: Secondary | ICD-10-CM | POA: Diagnosis not present

## 2016-01-01 DIAGNOSIS — M25662 Stiffness of left knee, not elsewhere classified: Secondary | ICD-10-CM | POA: Diagnosis not present

## 2016-01-01 DIAGNOSIS — M47814 Spondylosis without myelopathy or radiculopathy, thoracic region: Secondary | ICD-10-CM | POA: Diagnosis not present

## 2016-01-01 DIAGNOSIS — M9905 Segmental and somatic dysfunction of pelvic region: Secondary | ICD-10-CM | POA: Diagnosis not present

## 2016-01-01 DIAGNOSIS — M9902 Segmental and somatic dysfunction of thoracic region: Secondary | ICD-10-CM | POA: Diagnosis not present

## 2016-01-06 DIAGNOSIS — M6281 Muscle weakness (generalized): Secondary | ICD-10-CM | POA: Diagnosis not present

## 2016-01-06 DIAGNOSIS — Z96652 Presence of left artificial knee joint: Secondary | ICD-10-CM | POA: Diagnosis not present

## 2016-01-06 DIAGNOSIS — M25662 Stiffness of left knee, not elsewhere classified: Secondary | ICD-10-CM | POA: Diagnosis not present

## 2016-01-07 DIAGNOSIS — M47814 Spondylosis without myelopathy or radiculopathy, thoracic region: Secondary | ICD-10-CM | POA: Diagnosis not present

## 2016-01-07 DIAGNOSIS — M4716 Other spondylosis with myelopathy, lumbar region: Secondary | ICD-10-CM | POA: Diagnosis not present

## 2016-01-07 DIAGNOSIS — M5431 Sciatica, right side: Secondary | ICD-10-CM | POA: Diagnosis not present

## 2016-01-07 DIAGNOSIS — M9905 Segmental and somatic dysfunction of pelvic region: Secondary | ICD-10-CM | POA: Diagnosis not present

## 2016-01-07 DIAGNOSIS — M9903 Segmental and somatic dysfunction of lumbar region: Secondary | ICD-10-CM | POA: Diagnosis not present

## 2016-01-07 DIAGNOSIS — M9902 Segmental and somatic dysfunction of thoracic region: Secondary | ICD-10-CM | POA: Diagnosis not present

## 2016-01-08 DIAGNOSIS — M6281 Muscle weakness (generalized): Secondary | ICD-10-CM | POA: Diagnosis not present

## 2016-01-08 DIAGNOSIS — Z96652 Presence of left artificial knee joint: Secondary | ICD-10-CM | POA: Diagnosis not present

## 2016-01-08 DIAGNOSIS — M25662 Stiffness of left knee, not elsewhere classified: Secondary | ICD-10-CM | POA: Diagnosis not present

## 2016-01-20 DIAGNOSIS — Z96652 Presence of left artificial knee joint: Secondary | ICD-10-CM | POA: Diagnosis not present

## 2016-01-21 DIAGNOSIS — M5431 Sciatica, right side: Secondary | ICD-10-CM | POA: Diagnosis not present

## 2016-01-21 DIAGNOSIS — M4716 Other spondylosis with myelopathy, lumbar region: Secondary | ICD-10-CM | POA: Diagnosis not present

## 2016-01-21 DIAGNOSIS — M9903 Segmental and somatic dysfunction of lumbar region: Secondary | ICD-10-CM | POA: Diagnosis not present

## 2016-01-21 DIAGNOSIS — M9902 Segmental and somatic dysfunction of thoracic region: Secondary | ICD-10-CM | POA: Diagnosis not present

## 2016-01-21 DIAGNOSIS — M47814 Spondylosis without myelopathy or radiculopathy, thoracic region: Secondary | ICD-10-CM | POA: Diagnosis not present

## 2016-01-21 DIAGNOSIS — M9905 Segmental and somatic dysfunction of pelvic region: Secondary | ICD-10-CM | POA: Diagnosis not present

## 2016-02-02 DIAGNOSIS — M6281 Muscle weakness (generalized): Secondary | ICD-10-CM | POA: Diagnosis not present

## 2016-02-02 DIAGNOSIS — M25662 Stiffness of left knee, not elsewhere classified: Secondary | ICD-10-CM | POA: Diagnosis not present

## 2016-02-02 DIAGNOSIS — Z96652 Presence of left artificial knee joint: Secondary | ICD-10-CM | POA: Diagnosis not present

## 2016-02-03 DIAGNOSIS — M47814 Spondylosis without myelopathy or radiculopathy, thoracic region: Secondary | ICD-10-CM | POA: Diagnosis not present

## 2016-02-03 DIAGNOSIS — M4716 Other spondylosis with myelopathy, lumbar region: Secondary | ICD-10-CM | POA: Diagnosis not present

## 2016-02-03 DIAGNOSIS — M5431 Sciatica, right side: Secondary | ICD-10-CM | POA: Diagnosis not present

## 2016-02-03 DIAGNOSIS — M9903 Segmental and somatic dysfunction of lumbar region: Secondary | ICD-10-CM | POA: Diagnosis not present

## 2016-02-03 DIAGNOSIS — M9902 Segmental and somatic dysfunction of thoracic region: Secondary | ICD-10-CM | POA: Diagnosis not present

## 2016-02-03 DIAGNOSIS — M9905 Segmental and somatic dysfunction of pelvic region: Secondary | ICD-10-CM | POA: Diagnosis not present

## 2016-02-10 ENCOUNTER — Ambulatory Visit (INDEPENDENT_AMBULATORY_CARE_PROVIDER_SITE_OTHER): Payer: Medicare Other | Admitting: Cardiology

## 2016-02-10 ENCOUNTER — Encounter: Payer: Self-pay | Admitting: Cardiology

## 2016-02-10 VITALS — BP 142/98 | HR 66 | Ht 75.0 in | Wt 217.0 lb

## 2016-02-10 DIAGNOSIS — R079 Chest pain, unspecified: Secondary | ICD-10-CM | POA: Diagnosis not present

## 2016-02-10 MED ORDER — METOPROLOL TARTRATE 100 MG PO TABS: 50.0000 mg | ORAL_TABLET | Freq: Two times a day (BID) | ORAL | Status: AC

## 2016-02-10 NOTE — Patient Instructions (Signed)
Your physician has requested that you have a lexiscan myoview. For further information please visit [email or web address]. Please follow instruction sheet, as given.   

## 2016-02-10 NOTE — Progress Notes (Signed)
Cardiology Office Note   Date:  02/10/2016   ID:  [name], DOB 23-Feb-1946, MRN [id number]  PCP:  Harle Battiest, MD  Cardiologist:   Minus Breeding, MD   Chief Complaint  Patient presents with  . Chest Pain      History of Present Illness: [name] is a 70 y.o. male who presents for Evaluation of chest discomfort. He has coronary artery disease status post CABG almost 7 years ago. He says he's had stress testing but probably not the last for 5 years. He did undergo knee replacement earlier this year and did well with this. However, the last 2 weeks he's had one episode of shortness of breath and another episode of chest heaviness. This lasted for about 15 minutes. It was substernal and mild to moderate. There were no associated symptoms. There was no radiation to his jaw or to his arms. He had no shortness of breath, nausea or vomiting. He's had no palpitations, presyncope or syncope. It is not clear whether this was similar to previous. He's been doing his physical therapy has not been bringing this on days not as active as he plans to be more as it has been in the past.  Past Medical History  Diagnosis Date  . CAD (coronary artery disease)     s/p CABG x 5 in 2010; Dr. Darcey Nora  . Syncope     Negative EP evaluation  . HTN (hypertension)   . Hyperlipidemia   . ED (erectile dysfunction)   . Hypothyroid   . LBBB (left bundle branch block)   . Arthritis     knees   . Lumbar degenerative disc disease     followed by Dr. Limmie Patricia, for chiropractic care  . Cancer [facility]) 2005    throat, tx with surgery, radiation, chemotherapy    Past Surgical History  Procedure Laterality Date  . Coronary artery bypass graft  06/06/09    x 5  . Cardiac catheterization    . 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 Incisional hernia repair    . Total knee arthroplasty Bilateral 09/04/2014    Procedure: Intra-articular injection Left  knee, Excision of Chronic Pre-patella Bursa Right Knee , Right Total Knee Arthroplasty;  Surgeon: Ninetta Lights, MD;  Location: [place];  Service: Orthopedics;  Laterality: Bilateral;  . Eye surgery      10/15- cataracts removed , lasik - one eye for reading & one eye for distance   . Hernia repair  2000    inguinal   . Tonsillectomy      one complete side    . Fracture surgery  2009    R wrist, plate in place   . Total knee arthroplasty Left 12/03/2015    Procedure: LEFT TOTAL KNEE ARTHROPLASTY;  Surgeon: Ninetta Lights, MD;  Location: [place];  Service: Orthopedics;  Laterality: Left;     Current Outpatient Prescriptions  Medication Sig Dispense Refill  . aspirin 81 MG tablet Take 81 mg by mouth daily.    Marland Kitchen levothyroxine (SYNTHROID, LEVOTHROID) 150 MCG tablet Take 1 tablet (150 mcg total) by mouth daily. 90 tablet 3  . metoprolol (LOPRESSOR) 100 MG tablet Take 0.5 tablets (50 mg total) by mouth 2 (two) times daily. 180 tablet 3  . tadalafil (CIALIS) 20 MG tablet Take 20 mg by mouth daily as needed for erectile dysfunction.     No  current facility-administered medications for this visit.    Allergies:   Review of patient's allergies indicates no known allergies.    ROS:  Please see the history of present illness.   Otherwise, review of systems are positive for none.   All other systems are reviewed and negative.    PHYSICAL EXAM: VS:  BP 142/98 mmHg  Pulse 66  Ht 6\' 3"  (1.905 m)  Wt 217 lb (98.431 kg)  BMI 27.12 kg/m2 , BMI Body mass index is 27.12 kg/(m^2). GENERAL:  Well appearing HEENT:  Pupils equal round and reactive, fundi not visualized, oral mucosa unremarkable NECK:  No jugular venous distention, waveform within normal limits, carotid upstroke brisk and symmetric, no bruits, no thyromegaly LYMPHATICS:  No cervical, inguinal adenopathy LUNGS:  Clear to auscultation bilaterally BACK:  No CVA tenderness CHEST:  Well healed surgical scar.   HEART:  PMI not displaced or  sustained,S1 and S2 within normal limits, no S3, no S4, no clicks, no rubs, no murmurs ABD:  Flat, positive bowel sounds normal in frequency in pitch, no bruits, no rebound, no guarding, no midline pulsatile mass, no hepatomegaly, no splenomegaly EXT:  2 plus pulses throughout, no edema, no cyanosis no clubbing SKIN:  No rashes no nodules NEURO:  Cranial nerves II through XII grossly intact, motor grossly intact throughout PSYCH:  Cognitively intact, oriented to person place and time    EKG:  EKG is ordered today. The ekg ordered today demonstrates sinus rhythm with premature atrial contractions, left bundle branch block unchanged from previous.   Recent Labs: 06/19/2015: ALT 16; TSH 0.37 12/05/2015: BUN 10; Creatinine, Ser 0.90; Hemoglobin 9.3*; Platelets 168; Potassium 4.2; Sodium 141    Lipid Panel    Component Value Date/Time   CHOL 132 06/19/2015 0743   TRIG 83.0 06/19/2015 0743   HDL 51.30 06/19/2015 0743   CHOLHDL 3 06/19/2015 0743   VLDL 16.6 06/19/2015 0743   LDLCALC 64 06/19/2015 0743      Wt Readings from Last 3 Encounters:  02/10/16 217 lb (98.431 kg)  12/03/15 215 lb 9.6 oz (97.796 kg)  11/20/15 215 lb 9.6 oz (97.796 kg)      Other studies Reviewed: Additional studies/ records that were reviewed today include: . Review of the above records demonstrates:  Please see elsewhere in the note.     ASSESSMENT AND PLAN:  CHEST PAIN:  Chest pain is somewhat atypical. However, given his known disease he needs screening stress testing. With his left bundle branch block he cannot get POET (Plain Old Exercise Treadmill).  He will instead have a  [employer].  RISK REDUCTION:  He is not on a statin. However, his last was stroke profile from July of last year had an LDL in the 60s and an HDL in the 16s. I suggested that if this is still the case it would be reasonable to remain off statin. However, the goal would be to have an LDL less than 70 and he will have this  repeated.    Current medicines are reviewed at length with the patient today.  The patient does not have concerns regarding medicines.  The following changes have been made:  no change  Labs/ tests ordered today include:   Orders Placed This Encounter  Procedures  . Myocardial Perfusion Imaging  . EKG 12-Lead     Disposition:   [place]     Signed, Minus Breeding, MD  02/10/2016 1:25  PM    [facility]

## 2016-02-11 ENCOUNTER — Ambulatory Visit: Payer: 59 | Admitting: Cardiology

## 2016-02-16 DIAGNOSIS — M47814 Spondylosis without myelopathy or radiculopathy, thoracic region: Secondary | ICD-10-CM | POA: Diagnosis not present

## 2016-02-16 DIAGNOSIS — M5431 Sciatica, right side: Secondary | ICD-10-CM | POA: Diagnosis not present

## 2016-02-16 DIAGNOSIS — M9902 Segmental and somatic dysfunction of thoracic region: Secondary | ICD-10-CM | POA: Diagnosis not present

## 2016-02-16 DIAGNOSIS — M4716 Other spondylosis with myelopathy, lumbar region: Secondary | ICD-10-CM | POA: Diagnosis not present

## 2016-02-16 DIAGNOSIS — M9905 Segmental and somatic dysfunction of pelvic region: Secondary | ICD-10-CM | POA: Diagnosis not present

## 2016-02-16 DIAGNOSIS — M9903 Segmental and somatic dysfunction of lumbar region: Secondary | ICD-10-CM | POA: Diagnosis not present

## 2016-02-17 ENCOUNTER — Telehealth (HOSPITAL_COMMUNITY): Payer: Self-pay

## 2016-02-17 NOTE — Telephone Encounter (Signed)
Encounter complete. 

## 2016-02-18 ENCOUNTER — Ambulatory Visit (HOSPITAL_COMMUNITY)
Admission: RE | Admit: 2016-02-18 | Discharge: 2016-02-18 | Disposition: A | Discharge status: Home / Self Care | Payer: Medicare Other | Source: Ambulatory Visit | Attending: Cardiovascular Disease | Admitting: Cardiovascular Disease

## 2016-02-18 DIAGNOSIS — I10 Essential (primary) hypertension: Secondary | ICD-10-CM | POA: Insufficient documentation

## 2016-02-18 DIAGNOSIS — R0609 Other forms of dyspnea: Secondary | ICD-10-CM | POA: Diagnosis not present

## 2016-02-18 DIAGNOSIS — Z8249 Family history of ischemic heart disease and other diseases of the circulatory system: Secondary | ICD-10-CM | POA: Diagnosis not present

## 2016-02-18 DIAGNOSIS — R9439 Abnormal result of other cardiovascular function study: Secondary | ICD-10-CM | POA: Diagnosis not present

## 2016-02-18 DIAGNOSIS — I447 Left bundle-branch block, unspecified: Secondary | ICD-10-CM | POA: Diagnosis not present

## 2016-02-18 DIAGNOSIS — R0602 Shortness of breath: Secondary | ICD-10-CM | POA: Insufficient documentation

## 2016-02-18 DIAGNOSIS — R079 Chest pain, unspecified: Secondary | ICD-10-CM | POA: Diagnosis not present

## 2016-02-18 LAB — MYOCARDIAL PERFUSION IMAGING
CHL CUP NUCLEAR SRS: 4
CHL CUP RESTING HR STRESS: 55 {beats}/min
LV sys vol: 94 mL
LVDIAVOL: 193 mL (ref 62–150)
NUC STRESS TID: 1.11
Peak HR: 86 {beats}/min
SDS: 5
SSS: 9

## 2016-02-18 MED ORDER — TECHNETIUM TC 99M SESTAMIBI GENERIC - CARDIOLITE
30.5000 | Freq: Once | INTRAVENOUS | Status: AC | PRN
  Administered 2016-02-18: 30.5 via INTRAVENOUS

## 2016-02-18 MED ORDER — REGADENOSON 0.4 MG/5ML IV SOLN
0.4000 mg | Freq: Once | INTRAVENOUS | Status: AC
  Administered 2016-02-18: 0.4 mg via INTRAVENOUS

## 2016-02-18 MED ORDER — TECHNETIUM TC 99M SESTAMIBI GENERIC - CARDIOLITE
9.7000 | Freq: Once | INTRAVENOUS | Status: AC | PRN
  Administered 2016-02-18: 9.7 via INTRAVENOUS

## 2016-03-01 DIAGNOSIS — M9902 Segmental and somatic dysfunction of thoracic region: Secondary | ICD-10-CM | POA: Diagnosis not present

## 2016-03-01 DIAGNOSIS — M47814 Spondylosis without myelopathy or radiculopathy, thoracic region: Secondary | ICD-10-CM | POA: Diagnosis not present

## 2016-03-01 DIAGNOSIS — M9905 Segmental and somatic dysfunction of pelvic region: Secondary | ICD-10-CM | POA: Diagnosis not present

## 2016-03-01 DIAGNOSIS — M5431 Sciatica, right side: Secondary | ICD-10-CM | POA: Diagnosis not present

## 2016-03-01 DIAGNOSIS — M9903 Segmental and somatic dysfunction of lumbar region: Secondary | ICD-10-CM | POA: Diagnosis not present

## 2016-03-01 DIAGNOSIS — M4716 Other spondylosis with myelopathy, lumbar region: Secondary | ICD-10-CM | POA: Diagnosis not present

## 2016-03-08 DIAGNOSIS — M4716 Other spondylosis with myelopathy, lumbar region: Secondary | ICD-10-CM | POA: Diagnosis not present

## 2016-03-08 DIAGNOSIS — M9902 Segmental and somatic dysfunction of thoracic region: Secondary | ICD-10-CM | POA: Diagnosis not present

## 2016-03-08 DIAGNOSIS — M5431 Sciatica, right side: Secondary | ICD-10-CM | POA: Diagnosis not present

## 2016-03-08 DIAGNOSIS — M9903 Segmental and somatic dysfunction of lumbar region: Secondary | ICD-10-CM | POA: Diagnosis not present

## 2016-03-08 DIAGNOSIS — E039 Hypothyroidism, unspecified: Secondary | ICD-10-CM | POA: Diagnosis not present

## 2016-03-08 DIAGNOSIS — M9905 Segmental and somatic dysfunction of pelvic region: Secondary | ICD-10-CM | POA: Diagnosis not present

## 2016-03-08 DIAGNOSIS — M47814 Spondylosis without myelopathy or radiculopathy, thoracic region: Secondary | ICD-10-CM | POA: Diagnosis not present

## 2016-03-11 DIAGNOSIS — R6889 Other general symptoms and signs: Secondary | ICD-10-CM | POA: Diagnosis not present

## 2016-04-21 ENCOUNTER — Encounter: Payer: Medicare Other | Admitting: Gastroenterology

## 2016-05-19 DIAGNOSIS — M4716 Other spondylosis with myelopathy, lumbar region: Secondary | ICD-10-CM | POA: Diagnosis not present

## 2016-05-19 DIAGNOSIS — M47814 Spondylosis without myelopathy or radiculopathy, thoracic region: Secondary | ICD-10-CM | POA: Diagnosis not present

## 2016-05-19 DIAGNOSIS — M7042 Prepatellar bursitis, left knee: Secondary | ICD-10-CM | POA: Diagnosis not present

## 2016-05-19 DIAGNOSIS — M5431 Sciatica, right side: Secondary | ICD-10-CM | POA: Diagnosis not present

## 2016-05-19 DIAGNOSIS — M9902 Segmental and somatic dysfunction of thoracic region: Secondary | ICD-10-CM | POA: Diagnosis not present

## 2016-05-19 DIAGNOSIS — M7041 Prepatellar bursitis, right knee: Secondary | ICD-10-CM | POA: Diagnosis not present

## 2016-05-19 DIAGNOSIS — M9905 Segmental and somatic dysfunction of pelvic region: Secondary | ICD-10-CM | POA: Diagnosis not present

## 2016-05-19 DIAGNOSIS — M9903 Segmental and somatic dysfunction of lumbar region: Secondary | ICD-10-CM | POA: Diagnosis not present

## 2016-08-09 DIAGNOSIS — M4716 Other spondylosis with myelopathy, lumbar region: Secondary | ICD-10-CM | POA: Diagnosis not present

## 2016-08-09 DIAGNOSIS — M9905 Segmental and somatic dysfunction of pelvic region: Secondary | ICD-10-CM | POA: Diagnosis not present

## 2016-08-09 DIAGNOSIS — M9902 Segmental and somatic dysfunction of thoracic region: Secondary | ICD-10-CM | POA: Diagnosis not present

## 2016-08-09 DIAGNOSIS — M7041 Prepatellar bursitis, right knee: Secondary | ICD-10-CM | POA: Diagnosis not present

## 2016-08-09 DIAGNOSIS — M7042 Prepatellar bursitis, left knee: Secondary | ICD-10-CM | POA: Diagnosis not present

## 2016-08-09 DIAGNOSIS — M47814 Spondylosis without myelopathy or radiculopathy, thoracic region: Secondary | ICD-10-CM | POA: Diagnosis not present

## 2016-08-09 DIAGNOSIS — M5431 Sciatica, right side: Secondary | ICD-10-CM | POA: Diagnosis not present

## 2016-08-09 DIAGNOSIS — M9903 Segmental and somatic dysfunction of lumbar region: Secondary | ICD-10-CM | POA: Diagnosis not present

## 2016-08-10 DIAGNOSIS — M25561 Pain in right knee: Secondary | ICD-10-CM | POA: Diagnosis not present

## 2016-08-10 DIAGNOSIS — Z96652 Presence of left artificial knee joint: Secondary | ICD-10-CM | POA: Diagnosis not present

## 2016-08-30 DIAGNOSIS — M5431 Sciatica, right side: Secondary | ICD-10-CM | POA: Diagnosis not present

## 2016-08-30 DIAGNOSIS — M4716 Other spondylosis with myelopathy, lumbar region: Secondary | ICD-10-CM | POA: Diagnosis not present

## 2016-08-30 DIAGNOSIS — M7041 Prepatellar bursitis, right knee: Secondary | ICD-10-CM | POA: Diagnosis not present

## 2016-08-30 DIAGNOSIS — M7042 Prepatellar bursitis, left knee: Secondary | ICD-10-CM | POA: Diagnosis not present

## 2016-08-30 DIAGNOSIS — M9903 Segmental and somatic dysfunction of lumbar region: Secondary | ICD-10-CM | POA: Diagnosis not present

## 2016-08-30 DIAGNOSIS — M9905 Segmental and somatic dysfunction of pelvic region: Secondary | ICD-10-CM | POA: Diagnosis not present

## 2016-08-30 DIAGNOSIS — M47814 Spondylosis without myelopathy or radiculopathy, thoracic region: Secondary | ICD-10-CM | POA: Diagnosis not present

## 2016-08-30 DIAGNOSIS — M9902 Segmental and somatic dysfunction of thoracic region: Secondary | ICD-10-CM | POA: Diagnosis not present

## 2016-10-11 DIAGNOSIS — Z1211 Encounter for screening for malignant neoplasm of colon: Secondary | ICD-10-CM | POA: Diagnosis not present

## 2016-10-11 DIAGNOSIS — E039 Hypothyroidism, unspecified: Secondary | ICD-10-CM | POA: Diagnosis not present

## 2016-10-11 DIAGNOSIS — Z0000 Encounter for general adult medical examination without abnormal findings: Secondary | ICD-10-CM | POA: Diagnosis not present

## 2016-10-11 DIAGNOSIS — Z136 Encounter for screening for cardiovascular disorders: Secondary | ICD-10-CM | POA: Diagnosis not present

## 2016-10-11 DIAGNOSIS — I10 Essential (primary) hypertension: Secondary | ICD-10-CM | POA: Diagnosis not present

## 2016-10-11 DIAGNOSIS — Z125 Encounter for screening for malignant neoplasm of prostate: Secondary | ICD-10-CM | POA: Diagnosis not present

## 2016-11-03 DIAGNOSIS — M5431 Sciatica, right side: Secondary | ICD-10-CM | POA: Diagnosis not present

## 2016-11-03 DIAGNOSIS — M9902 Segmental and somatic dysfunction of thoracic region: Secondary | ICD-10-CM | POA: Diagnosis not present

## 2016-11-03 DIAGNOSIS — M47814 Spondylosis without myelopathy or radiculopathy, thoracic region: Secondary | ICD-10-CM | POA: Diagnosis not present

## 2016-11-03 DIAGNOSIS — M4716 Other spondylosis with myelopathy, lumbar region: Secondary | ICD-10-CM | POA: Diagnosis not present

## 2016-11-08 DIAGNOSIS — I10 Essential (primary) hypertension: Secondary | ICD-10-CM | POA: Diagnosis not present

## 2016-11-08 DIAGNOSIS — Z1211 Encounter for screening for malignant neoplasm of colon: Secondary | ICD-10-CM | POA: Diagnosis not present

## 2016-11-08 DIAGNOSIS — I2510 Atherosclerotic heart disease of native coronary artery without angina pectoris: Secondary | ICD-10-CM | POA: Diagnosis not present

## 2016-11-08 DIAGNOSIS — E7800 Pure hypercholesterolemia, unspecified: Secondary | ICD-10-CM | POA: Diagnosis not present

## 2016-12-04 IMAGING — DX DG KNEE 1-2V PORT*L*
2 series · 2 of 2 positions shown · non-contrast
Comparison: None.

CLINICAL DATA: Status post total left knee replacement

EXAM:
PORTABLE LEFT KNEE - 1-2 VIEW

[knee ap]
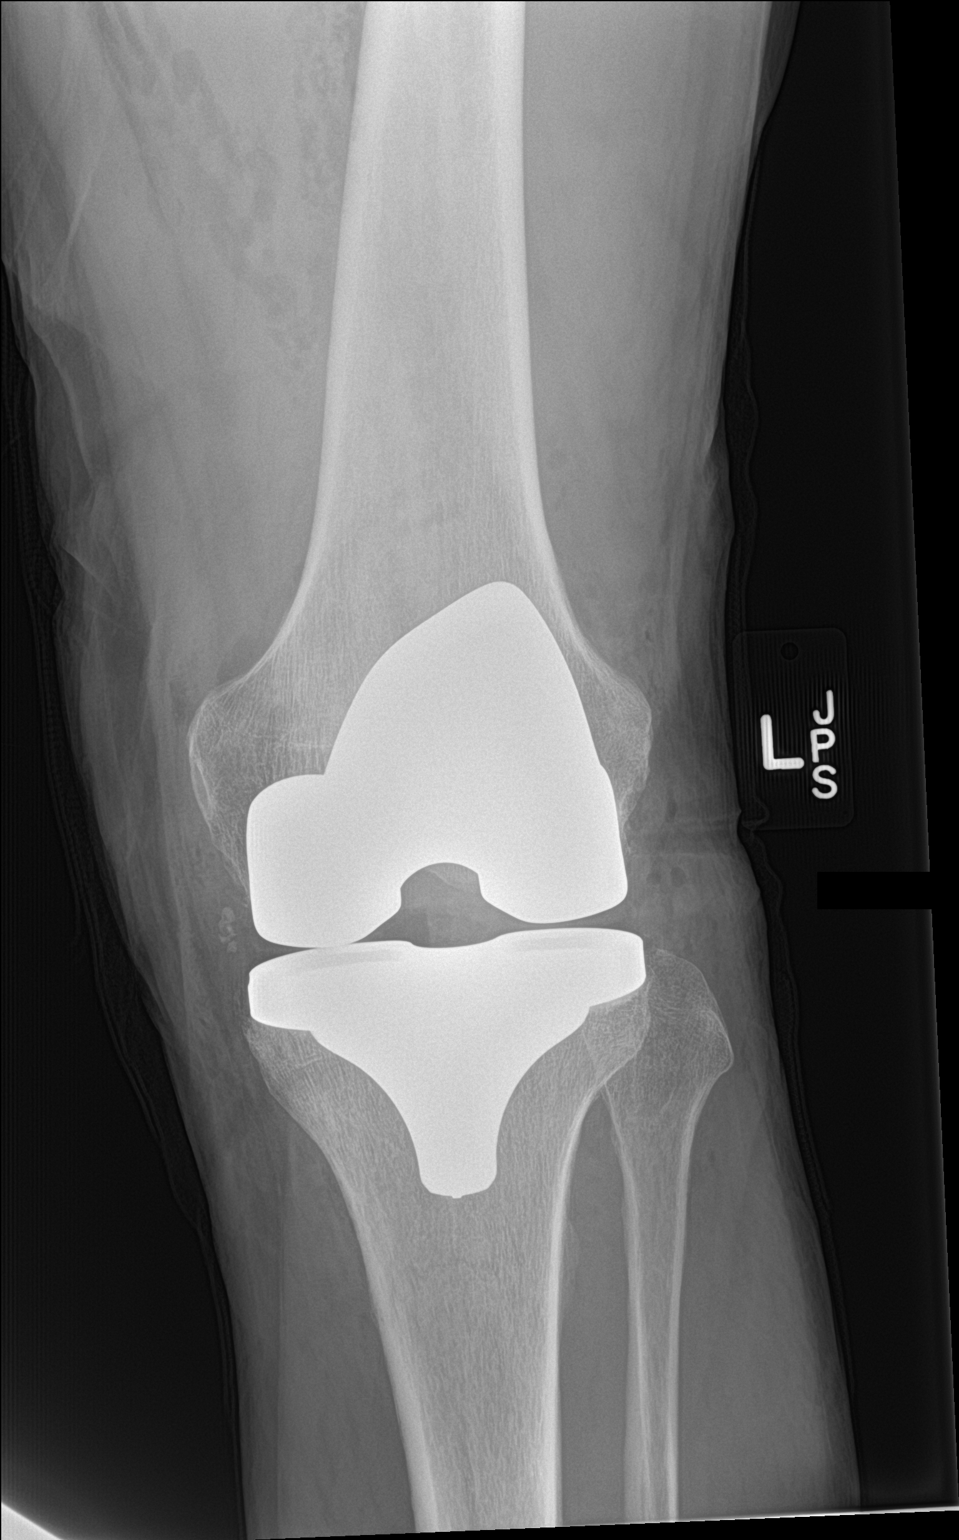

[knee lat]
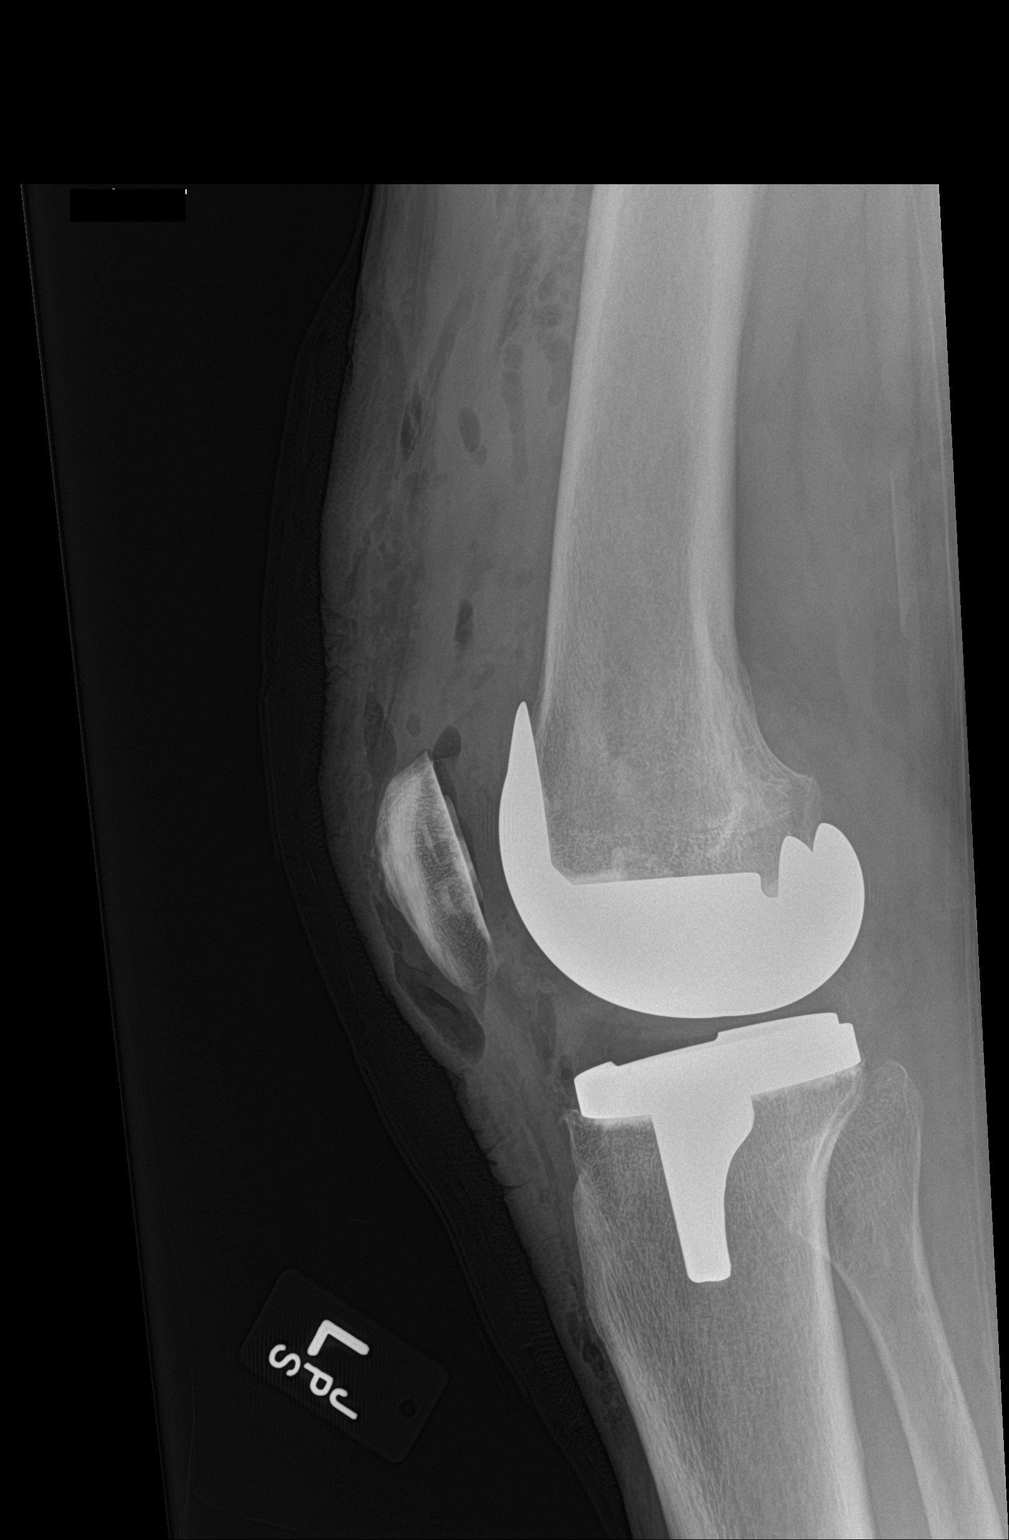

[2 of 2 positions shown; findings below may reference images not displayed]

FINDINGS: Tricompartmental knee prosthesis with components in anticipated
position. There is air and fluid over the knee joint
postoperatively.
IMPRESSION: Anticipated postoperative appearance

## 2016-12-13 DIAGNOSIS — M47814 Spondylosis without myelopathy or radiculopathy, thoracic region: Secondary | ICD-10-CM | POA: Diagnosis not present

## 2016-12-13 DIAGNOSIS — M9902 Segmental and somatic dysfunction of thoracic region: Secondary | ICD-10-CM | POA: Diagnosis not present

## 2016-12-13 DIAGNOSIS — M4716 Other spondylosis with myelopathy, lumbar region: Secondary | ICD-10-CM | POA: Diagnosis not present

## 2016-12-13 DIAGNOSIS — M5431 Sciatica, right side: Secondary | ICD-10-CM | POA: Diagnosis not present

## 2016-12-30 DIAGNOSIS — M9902 Segmental and somatic dysfunction of thoracic region: Secondary | ICD-10-CM | POA: Diagnosis not present

## 2016-12-30 DIAGNOSIS — M4716 Other spondylosis with myelopathy, lumbar region: Secondary | ICD-10-CM | POA: Diagnosis not present

## 2016-12-30 DIAGNOSIS — M5431 Sciatica, right side: Secondary | ICD-10-CM | POA: Diagnosis not present

## 2016-12-30 DIAGNOSIS — M47814 Spondylosis without myelopathy or radiculopathy, thoracic region: Secondary | ICD-10-CM | POA: Diagnosis not present

## 2017-01-20 DIAGNOSIS — Z8589 Personal history of malignant neoplasm of other organs and systems: Secondary | ICD-10-CM | POA: Diagnosis not present

## 2017-01-20 DIAGNOSIS — Z7982 Long term (current) use of aspirin: Secondary | ICD-10-CM | POA: Diagnosis not present

## 2017-01-20 DIAGNOSIS — D124 Benign neoplasm of descending colon: Secondary | ICD-10-CM | POA: Diagnosis not present

## 2017-01-20 DIAGNOSIS — Z1211 Encounter for screening for malignant neoplasm of colon: Secondary | ICD-10-CM | POA: Diagnosis not present

## 2017-01-20 DIAGNOSIS — E039 Hypothyroidism, unspecified: Secondary | ICD-10-CM | POA: Diagnosis not present

## 2017-01-20 DIAGNOSIS — K635 Polyp of colon: Secondary | ICD-10-CM | POA: Diagnosis not present

## 2017-01-20 DIAGNOSIS — I2510 Atherosclerotic heart disease of native coronary artery without angina pectoris: Secondary | ICD-10-CM | POA: Diagnosis not present

## 2017-01-20 DIAGNOSIS — Z96651 Presence of right artificial knee joint: Secondary | ICD-10-CM | POA: Diagnosis not present

## 2017-01-20 DIAGNOSIS — Z951 Presence of aortocoronary bypass graft: Secondary | ICD-10-CM | POA: Diagnosis not present

## 2017-01-20 DIAGNOSIS — K5730 Diverticulosis of large intestine without perforation or abscess without bleeding: Secondary | ICD-10-CM | POA: Diagnosis not present

## 2017-01-20 DIAGNOSIS — D123 Benign neoplasm of transverse colon: Secondary | ICD-10-CM | POA: Diagnosis not present

## 2017-01-20 DIAGNOSIS — Z79899 Other long term (current) drug therapy: Secondary | ICD-10-CM | POA: Diagnosis not present

## 2017-01-31 DIAGNOSIS — Z09 Encounter for follow-up examination after completed treatment for conditions other than malignant neoplasm: Secondary | ICD-10-CM | POA: Diagnosis not present

## 2017-01-31 DIAGNOSIS — Z86010 Personal history of colonic polyps: Secondary | ICD-10-CM | POA: Diagnosis not present

## 2017-01-31 DIAGNOSIS — I10 Essential (primary) hypertension: Secondary | ICD-10-CM | POA: Diagnosis not present

## 2017-03-02 DIAGNOSIS — M9902 Segmental and somatic dysfunction of thoracic region: Secondary | ICD-10-CM | POA: Diagnosis not present

## 2017-03-02 DIAGNOSIS — M9904 Segmental and somatic dysfunction of sacral region: Secondary | ICD-10-CM | POA: Diagnosis not present

## 2017-03-02 DIAGNOSIS — M9901 Segmental and somatic dysfunction of cervical region: Secondary | ICD-10-CM | POA: Diagnosis not present

## 2017-03-02 DIAGNOSIS — M9903 Segmental and somatic dysfunction of lumbar region: Secondary | ICD-10-CM | POA: Diagnosis not present

## 2017-03-07 DIAGNOSIS — M9901 Segmental and somatic dysfunction of cervical region: Secondary | ICD-10-CM | POA: Diagnosis not present

## 2017-03-07 DIAGNOSIS — M9902 Segmental and somatic dysfunction of thoracic region: Secondary | ICD-10-CM | POA: Diagnosis not present

## 2017-03-07 DIAGNOSIS — M9903 Segmental and somatic dysfunction of lumbar region: Secondary | ICD-10-CM | POA: Diagnosis not present

## 2017-03-07 DIAGNOSIS — M9904 Segmental and somatic dysfunction of sacral region: Secondary | ICD-10-CM | POA: Diagnosis not present

## 2017-03-09 DIAGNOSIS — M9902 Segmental and somatic dysfunction of thoracic region: Secondary | ICD-10-CM | POA: Diagnosis not present

## 2017-03-09 DIAGNOSIS — M9904 Segmental and somatic dysfunction of sacral region: Secondary | ICD-10-CM | POA: Diagnosis not present

## 2017-03-09 DIAGNOSIS — M9901 Segmental and somatic dysfunction of cervical region: Secondary | ICD-10-CM | POA: Diagnosis not present

## 2017-03-09 DIAGNOSIS — M9903 Segmental and somatic dysfunction of lumbar region: Secondary | ICD-10-CM | POA: Diagnosis not present

## 2017-03-29 DIAGNOSIS — M9903 Segmental and somatic dysfunction of lumbar region: Secondary | ICD-10-CM | POA: Diagnosis not present

## 2017-03-29 DIAGNOSIS — M9904 Segmental and somatic dysfunction of sacral region: Secondary | ICD-10-CM | POA: Diagnosis not present

## 2017-03-29 DIAGNOSIS — M9901 Segmental and somatic dysfunction of cervical region: Secondary | ICD-10-CM | POA: Diagnosis not present

## 2017-03-29 DIAGNOSIS — M9902 Segmental and somatic dysfunction of thoracic region: Secondary | ICD-10-CM | POA: Diagnosis not present

## 2017-04-06 DIAGNOSIS — L905 Scar conditions and fibrosis of skin: Secondary | ICD-10-CM | POA: Diagnosis not present

## 2017-04-06 DIAGNOSIS — L821 Other seborrheic keratosis: Secondary | ICD-10-CM | POA: Diagnosis not present

## 2017-04-06 DIAGNOSIS — D2262 Melanocytic nevi of left upper limb, including shoulder: Secondary | ICD-10-CM | POA: Diagnosis not present

## 2017-04-06 DIAGNOSIS — D2261 Melanocytic nevi of right upper limb, including shoulder: Secondary | ICD-10-CM | POA: Diagnosis not present

## 2017-04-06 DIAGNOSIS — L7211 Pilar cyst: Secondary | ICD-10-CM | POA: Diagnosis not present

## 2017-04-06 DIAGNOSIS — D2272 Melanocytic nevi of left lower limb, including hip: Secondary | ICD-10-CM | POA: Diagnosis not present

## 2017-04-06 DIAGNOSIS — L814 Other melanin hyperpigmentation: Secondary | ICD-10-CM | POA: Diagnosis not present

## 2017-04-06 DIAGNOSIS — L218 Other seborrheic dermatitis: Secondary | ICD-10-CM | POA: Diagnosis not present

## 2017-04-06 DIAGNOSIS — D2271 Melanocytic nevi of right lower limb, including hip: Secondary | ICD-10-CM | POA: Diagnosis not present

## 2017-04-06 DIAGNOSIS — D225 Melanocytic nevi of trunk: Secondary | ICD-10-CM | POA: Diagnosis not present

## 2017-04-06 DIAGNOSIS — L570 Actinic keratosis: Secondary | ICD-10-CM | POA: Diagnosis not present

## 2017-04-12 DIAGNOSIS — E785 Hyperlipidemia, unspecified: Secondary | ICD-10-CM | POA: Diagnosis not present

## 2017-04-12 DIAGNOSIS — M1710 Unilateral primary osteoarthritis, unspecified knee: Secondary | ICD-10-CM | POA: Diagnosis not present

## 2017-04-12 DIAGNOSIS — I10 Essential (primary) hypertension: Secondary | ICD-10-CM | POA: Diagnosis not present

## 2017-04-22 DIAGNOSIS — M9902 Segmental and somatic dysfunction of thoracic region: Secondary | ICD-10-CM | POA: Diagnosis not present

## 2017-04-22 DIAGNOSIS — M9901 Segmental and somatic dysfunction of cervical region: Secondary | ICD-10-CM | POA: Diagnosis not present

## 2017-04-22 DIAGNOSIS — M9903 Segmental and somatic dysfunction of lumbar region: Secondary | ICD-10-CM | POA: Diagnosis not present

## 2017-04-22 DIAGNOSIS — M9904 Segmental and somatic dysfunction of sacral region: Secondary | ICD-10-CM | POA: Diagnosis not present

## 2017-04-28 DIAGNOSIS — M5431 Sciatica, right side: Secondary | ICD-10-CM | POA: Diagnosis not present

## 2017-04-28 DIAGNOSIS — M4716 Other spondylosis with myelopathy, lumbar region: Secondary | ICD-10-CM | POA: Diagnosis not present

## 2017-04-28 DIAGNOSIS — M9902 Segmental and somatic dysfunction of thoracic region: Secondary | ICD-10-CM | POA: Diagnosis not present

## 2017-04-28 DIAGNOSIS — M47814 Spondylosis without myelopathy or radiculopathy, thoracic region: Secondary | ICD-10-CM | POA: Diagnosis not present

## 2017-05-05 DIAGNOSIS — M9901 Segmental and somatic dysfunction of cervical region: Secondary | ICD-10-CM | POA: Diagnosis not present

## 2017-05-05 DIAGNOSIS — M9904 Segmental and somatic dysfunction of sacral region: Secondary | ICD-10-CM | POA: Diagnosis not present

## 2017-05-05 DIAGNOSIS — M9903 Segmental and somatic dysfunction of lumbar region: Secondary | ICD-10-CM | POA: Diagnosis not present

## 2017-05-05 DIAGNOSIS — M9902 Segmental and somatic dysfunction of thoracic region: Secondary | ICD-10-CM | POA: Diagnosis not present

## 2017-05-23 DIAGNOSIS — M9904 Segmental and somatic dysfunction of sacral region: Secondary | ICD-10-CM | POA: Diagnosis not present

## 2017-05-23 DIAGNOSIS — M9903 Segmental and somatic dysfunction of lumbar region: Secondary | ICD-10-CM | POA: Diagnosis not present

## 2017-05-23 DIAGNOSIS — M9901 Segmental and somatic dysfunction of cervical region: Secondary | ICD-10-CM | POA: Diagnosis not present

## 2017-05-23 DIAGNOSIS — M9902 Segmental and somatic dysfunction of thoracic region: Secondary | ICD-10-CM | POA: Diagnosis not present

## 2017-06-20 DIAGNOSIS — M9901 Segmental and somatic dysfunction of cervical region: Secondary | ICD-10-CM | POA: Diagnosis not present

## 2017-06-20 DIAGNOSIS — M9902 Segmental and somatic dysfunction of thoracic region: Secondary | ICD-10-CM | POA: Diagnosis not present

## 2017-06-20 DIAGNOSIS — M9904 Segmental and somatic dysfunction of sacral region: Secondary | ICD-10-CM | POA: Diagnosis not present

## 2017-06-20 DIAGNOSIS — M9903 Segmental and somatic dysfunction of lumbar region: Secondary | ICD-10-CM | POA: Diagnosis not present

## 2017-07-21 DIAGNOSIS — M9901 Segmental and somatic dysfunction of cervical region: Secondary | ICD-10-CM | POA: Diagnosis not present

## 2017-07-21 DIAGNOSIS — M9904 Segmental and somatic dysfunction of sacral region: Secondary | ICD-10-CM | POA: Diagnosis not present

## 2017-07-21 DIAGNOSIS — M9902 Segmental and somatic dysfunction of thoracic region: Secondary | ICD-10-CM | POA: Diagnosis not present

## 2017-07-21 DIAGNOSIS — M9903 Segmental and somatic dysfunction of lumbar region: Secondary | ICD-10-CM | POA: Diagnosis not present

## 2017-07-27 DIAGNOSIS — M9901 Segmental and somatic dysfunction of cervical region: Secondary | ICD-10-CM | POA: Diagnosis not present

## 2017-07-27 DIAGNOSIS — S161XXA Strain of muscle, fascia and tendon at neck level, initial encounter: Secondary | ICD-10-CM | POA: Diagnosis not present

## 2017-07-27 DIAGNOSIS — M9902 Segmental and somatic dysfunction of thoracic region: Secondary | ICD-10-CM | POA: Diagnosis not present

## 2017-07-27 DIAGNOSIS — M62830 Muscle spasm of back: Secondary | ICD-10-CM | POA: Diagnosis not present

## 2017-07-29 DIAGNOSIS — S161XXA Strain of muscle, fascia and tendon at neck level, initial encounter: Secondary | ICD-10-CM | POA: Diagnosis not present

## 2017-07-29 DIAGNOSIS — M62830 Muscle spasm of back: Secondary | ICD-10-CM | POA: Diagnosis not present

## 2017-07-29 DIAGNOSIS — M9902 Segmental and somatic dysfunction of thoracic region: Secondary | ICD-10-CM | POA: Diagnosis not present

## 2017-07-29 DIAGNOSIS — M9901 Segmental and somatic dysfunction of cervical region: Secondary | ICD-10-CM | POA: Diagnosis not present

## 2017-08-01 DIAGNOSIS — S161XXA Strain of muscle, fascia and tendon at neck level, initial encounter: Secondary | ICD-10-CM | POA: Diagnosis not present

## 2017-08-01 DIAGNOSIS — M9901 Segmental and somatic dysfunction of cervical region: Secondary | ICD-10-CM | POA: Diagnosis not present

## 2017-08-01 DIAGNOSIS — M62830 Muscle spasm of back: Secondary | ICD-10-CM | POA: Diagnosis not present

## 2017-08-01 DIAGNOSIS — M9902 Segmental and somatic dysfunction of thoracic region: Secondary | ICD-10-CM | POA: Diagnosis not present

## 2017-09-02 DIAGNOSIS — M25562 Pain in left knee: Secondary | ICD-10-CM | POA: Diagnosis not present

## 2017-09-02 DIAGNOSIS — M65311 Trigger thumb, right thumb: Secondary | ICD-10-CM | POA: Diagnosis not present

## 2017-09-21 DIAGNOSIS — M9901 Segmental and somatic dysfunction of cervical region: Secondary | ICD-10-CM | POA: Diagnosis not present

## 2017-09-21 DIAGNOSIS — S161XXA Strain of muscle, fascia and tendon at neck level, initial encounter: Secondary | ICD-10-CM | POA: Diagnosis not present

## 2017-09-21 DIAGNOSIS — M9902 Segmental and somatic dysfunction of thoracic region: Secondary | ICD-10-CM | POA: Diagnosis not present

## 2017-09-21 DIAGNOSIS — M62830 Muscle spasm of back: Secondary | ICD-10-CM | POA: Diagnosis not present

## 2017-09-28 DIAGNOSIS — E039 Hypothyroidism, unspecified: Secondary | ICD-10-CM | POA: Diagnosis not present

## 2017-09-28 DIAGNOSIS — I10 Essential (primary) hypertension: Secondary | ICD-10-CM | POA: Diagnosis not present

## 2017-10-10 DIAGNOSIS — M62830 Muscle spasm of back: Secondary | ICD-10-CM | POA: Diagnosis not present

## 2017-10-10 DIAGNOSIS — M9902 Segmental and somatic dysfunction of thoracic region: Secondary | ICD-10-CM | POA: Diagnosis not present

## 2017-10-10 DIAGNOSIS — M9901 Segmental and somatic dysfunction of cervical region: Secondary | ICD-10-CM | POA: Diagnosis not present

## 2017-10-10 DIAGNOSIS — S161XXA Strain of muscle, fascia and tendon at neck level, initial encounter: Secondary | ICD-10-CM | POA: Diagnosis not present

## 2017-11-10 DIAGNOSIS — M9902 Segmental and somatic dysfunction of thoracic region: Secondary | ICD-10-CM | POA: Diagnosis not present

## 2017-11-10 DIAGNOSIS — M62830 Muscle spasm of back: Secondary | ICD-10-CM | POA: Diagnosis not present

## 2017-11-10 DIAGNOSIS — M9901 Segmental and somatic dysfunction of cervical region: Secondary | ICD-10-CM | POA: Diagnosis not present

## 2017-11-10 DIAGNOSIS — S161XXA Strain of muscle, fascia and tendon at neck level, initial encounter: Secondary | ICD-10-CM | POA: Diagnosis not present
